# Patient Record
Sex: Female | Born: 1970 | Race: White | Hispanic: No | Marital: Married | Smoking: Never smoker
Health system: Southern US, Community
[De-identification: ages and names within clinical notes are randomized; demographics above are authoritative.]

## PROBLEM LIST (undated history)

## (undated) DIAGNOSIS — E559 Vitamin D deficiency, unspecified: Secondary | ICD-10-CM

## (undated) DIAGNOSIS — T7840XA Allergy, unspecified, initial encounter: Secondary | ICD-10-CM

## (undated) DIAGNOSIS — M1990 Unspecified osteoarthritis, unspecified site: Secondary | ICD-10-CM

## (undated) DIAGNOSIS — D6851 Activated protein C resistance: Secondary | ICD-10-CM

## (undated) DIAGNOSIS — N3281 Overactive bladder: Secondary | ICD-10-CM

## (undated) HISTORY — DX: Unspecified osteoarthritis, unspecified site: M19.90

## (undated) HISTORY — DX: Allergy, unspecified, initial encounter: T78.40XA

## (undated) HISTORY — PX: ABLATION: SHX5711

## (undated) HISTORY — PX: ABDOMINAL HYSTERECTOMY: SHX81

## (undated) HISTORY — PX: WISDOM TOOTH EXTRACTION: SHX21

## (undated) HISTORY — DX: Activated protein C resistance: D68.51

## (undated) HISTORY — DX: Overactive bladder: N32.81

## (undated) HISTORY — DX: Vitamin D deficiency, unspecified: E55.9

---

## 1997-11-09 ENCOUNTER — Other Ambulatory Visit: Admission: RE | Admit: 1997-11-09 | Discharge: 1997-11-09 | Payer: Self-pay | Admitting: Obstetrics and Gynecology

## 1999-02-25 HISTORY — PX: AUGMENTATION MAMMAPLASTY: SUR837

## 1999-02-25 HISTORY — PX: BREAST SURGERY: SHX581

## 1999-03-25 ENCOUNTER — Other Ambulatory Visit: Admission: RE | Admit: 1999-03-25 | Discharge: 1999-03-25 | Payer: Self-pay | Admitting: Obstetrics and Gynecology

## 2000-08-20 ENCOUNTER — Other Ambulatory Visit: Admission: RE | Admit: 2000-08-20 | Discharge: 2000-08-20 | Payer: Self-pay | Admitting: Obstetrics and Gynecology

## 2002-05-11 ENCOUNTER — Other Ambulatory Visit: Admission: RE | Admit: 2002-05-11 | Discharge: 2002-05-11 | Payer: Self-pay | Admitting: Obstetrics and Gynecology

## 2003-06-14 ENCOUNTER — Other Ambulatory Visit: Admission: RE | Admit: 2003-06-14 | Discharge: 2003-06-14 | Payer: Self-pay | Admitting: Obstetrics and Gynecology

## 2004-07-01 ENCOUNTER — Other Ambulatory Visit: Admission: RE | Admit: 2004-07-01 | Discharge: 2004-07-01 | Payer: Self-pay | Admitting: Obstetrics and Gynecology

## 2004-09-11 ENCOUNTER — Ambulatory Visit: Payer: Self-pay | Admitting: Internal Medicine

## 2005-09-29 ENCOUNTER — Encounter: Admission: RE | Admit: 2005-09-29 | Discharge: 2005-09-29 | Payer: Self-pay | Admitting: Obstetrics and Gynecology

## 2010-08-17 ENCOUNTER — Encounter: Payer: Self-pay | Admitting: Obstetrics and Gynecology

## 2013-05-18 ENCOUNTER — Other Ambulatory Visit: Payer: Self-pay | Admitting: Obstetrics and Gynecology

## 2013-05-18 DIAGNOSIS — R928 Other abnormal and inconclusive findings on diagnostic imaging of breast: Secondary | ICD-10-CM

## 2013-06-01 ENCOUNTER — Ambulatory Visit
Admission: RE | Admit: 2013-06-01 | Discharge: 2013-06-01 | Disposition: A | Discharge status: Home / Self Care | Payer: PRIVATE HEALTH INSURANCE | Source: Ambulatory Visit | Attending: Obstetrics and Gynecology | Admitting: Obstetrics and Gynecology

## 2013-06-01 DIAGNOSIS — R928 Other abnormal and inconclusive findings on diagnostic imaging of breast: Secondary | ICD-10-CM

## 2013-06-13 ENCOUNTER — Encounter (HOSPITAL_COMMUNITY): Payer: Self-pay | Admitting: Pharmacist

## 2013-06-14 ENCOUNTER — Encounter (HOSPITAL_COMMUNITY): Payer: Self-pay

## 2013-06-14 ENCOUNTER — Encounter (HOSPITAL_COMMUNITY)
Admission: RE | Admit: 2013-06-14 | Discharge: 2013-06-14 | Disposition: A | Discharge status: Home / Self Care | Payer: PRIVATE HEALTH INSURANCE | Source: Ambulatory Visit | Attending: Obstetrics and Gynecology | Admitting: Obstetrics and Gynecology

## 2013-06-14 DIAGNOSIS — Z01812 Encounter for preprocedural laboratory examination: Secondary | ICD-10-CM | POA: Insufficient documentation

## 2013-06-14 DIAGNOSIS — Z01818 Encounter for other preprocedural examination: Secondary | ICD-10-CM | POA: Insufficient documentation

## 2013-06-14 LAB — CBC
Hemoglobin: 15 g/dL (ref 12.0–15.0)
Platelets: 190 10*3/uL (ref 150–400)
RBC: 5.07 MIL/uL (ref 3.87–5.11)
WBC: 6.2 10*3/uL (ref 4.0–10.5)

## 2013-06-14 NOTE — Patient Instructions (Addendum)
   Your procedure is scheduled on:  Monday, Dec 1  Enter through the [employer] [facility] at: 6 AM Pick up the phone at the desk and dial [phone] and inform us of your arrival.  Please call this number if you have any problems the morning of surgery: [phone]  Remember: Do not eat or drink after midnight: Sunday Take these medicines the morning of surgery with a SIP OF WATER:  None  Do not wear jewelry, make-up, or FINGER nail polish No metal in your hair or on your body. Do not wear lotions, powders, perfumes. You may wear deodorant.  Please use your CHG wash as directed prior to surgery.  Do not shave anywhere for at least 12 hours prior to first CHG shower.  Do not bring valuables to the hospital. Contacts, dentures or bridgework may not be worn into surgery.  Leave suitcase in the car. After Surgery it may be brought to your room. For patients being admitted to the hospital, checkout time is 11:00am the day of discharge.  Home with husband Caryn Bee  cell [phone] or daughter Lorelle Formosa

## 2013-06-20 NOTE — H&P (Addendum)
42 yo with large fibroid uterus presents for surgical mngt.  PMHx:  neg PSHx:  Breast augmentation, SVD x 2 All:  None Meds: none SHx:  Negative x 3 FHx;  Pancreatic ca  AF, VSS Gen - NAD Abd - soft, NT CV - RRR Lungs - clear Ext NT PV - 16 wk size uterus  US:  7.8 cm fibroid, normal ovaries bilaterally  A/P:  Fibroid LAVH, possible TAH R/b/a reveiwed, plan of care discussed.  Informed consent obtained

## 2013-06-25 MED ORDER — DEXTROSE 5 % IV SOLN
2.0000 g | INTRAVENOUS | Status: AC
  Administered 2013-06-26: 2 g via INTRAVENOUS
  Filled 2013-06-25: qty 2

## 2013-06-26 ENCOUNTER — Observation Stay (HOSPITAL_COMMUNITY)
Admission: RE | Admit: 2013-06-26 | Discharge: 2013-06-27 | Disposition: A | Discharge status: Home / Self Care | Payer: PRIVATE HEALTH INSURANCE | Source: Ambulatory Visit | Attending: Obstetrics and Gynecology | Admitting: Obstetrics and Gynecology

## 2013-06-26 ENCOUNTER — Encounter (HOSPITAL_COMMUNITY): Payer: Self-pay

## 2013-06-26 ENCOUNTER — Encounter (HOSPITAL_COMMUNITY): Payer: PRIVATE HEALTH INSURANCE | Admitting: Anesthesiology

## 2013-06-26 ENCOUNTER — Encounter (HOSPITAL_COMMUNITY)
Admission: RE | Disposition: A | Discharge status: Home / Self Care | Payer: Self-pay | Source: Ambulatory Visit | Attending: Obstetrics and Gynecology

## 2013-06-26 ENCOUNTER — Ambulatory Visit (HOSPITAL_COMMUNITY): Payer: PRIVATE HEALTH INSURANCE | Admitting: Anesthesiology

## 2013-06-26 DIAGNOSIS — D219 Benign neoplasm of connective and other soft tissue, unspecified: Secondary | ICD-10-CM | POA: Diagnosis present

## 2013-06-26 DIAGNOSIS — D259 Leiomyoma of uterus, unspecified: Principal | ICD-10-CM | POA: Insufficient documentation

## 2013-06-26 HISTORY — PX: LAPAROSCOPIC ASSISTED VAGINAL HYSTERECTOMY: SHX5398

## 2013-06-26 LAB — TYPE AND SCREEN: ABO/RH(D): A POS

## 2013-06-26 LAB — ABO/RH: ABO/RH(D): A POS

## 2013-06-26 SURGERY — HYSTERECTOMY, VAGINAL, LAPAROSCOPY-ASSISTED
Anesthesia: General | Wound class: Clean Contaminated

## 2013-06-26 MED ORDER — KETOROLAC TROMETHAMINE 30 MG/ML IJ SOLN
30.0000 mg | Freq: Three times a day (TID) | INTRAMUSCULAR | Status: AC
  Administered 2013-06-26 – 2013-06-27 (×3): 30 mg via INTRAVENOUS
  Filled 2013-06-26 (×2): qty 1

## 2013-06-26 MED ORDER — DEXTROSE IN LACTATED RINGERS 5 % IV SOLN
INTRAVENOUS | Status: DC
  Administered 2013-06-26 – 2013-06-27 (×2): via INTRAVENOUS

## 2013-06-26 MED ORDER — MENTHOL 3 MG MT LOZG: 1.0000 | LOZENGE | OROMUCOSAL | Status: DC | PRN

## 2013-06-26 MED ORDER — OXYCODONE-ACETAMINOPHEN 5-325 MG PO TABS
1.0000 | ORAL_TABLET | ORAL | Status: DC | PRN
  Administered 2013-06-26: 1 via ORAL
  Filled 2013-06-26: qty 1

## 2013-06-26 MED ORDER — NEOSTIGMINE METHYLSULFATE 1 MG/ML IJ SOLN
INTRAMUSCULAR | Status: AC
  Filled 2013-06-26: qty 1

## 2013-06-26 MED ORDER — HYDROMORPHONE HCL PF 1 MG/ML IJ SOLN
INTRAMUSCULAR | Status: AC
  Filled 2013-06-26: qty 1

## 2013-06-26 MED ORDER — GLYCOPYRROLATE 0.2 MG/ML IJ SOLN
INTRAMUSCULAR | Status: AC
  Filled 2013-06-26: qty 1

## 2013-06-26 MED ORDER — GLYCOPYRROLATE 0.2 MG/ML IJ SOLN
INTRAMUSCULAR | Status: AC
  Filled 2013-06-26: qty 2

## 2013-06-26 MED ORDER — PROPOFOL 10 MG/ML IV EMUL
INTRAVENOUS | Status: AC
  Filled 2013-06-26: qty 20

## 2013-06-26 MED ORDER — BUPIVACAINE HCL (PF) 0.25 % IJ SOLN
INTRAMUSCULAR | Status: DC | PRN
  Administered 2013-06-26: 5 mL

## 2013-06-26 MED ORDER — ONDANSETRON HCL 4 MG/2ML IJ SOLN: 4.0000 mg | Freq: Four times a day (QID) | INTRAMUSCULAR | Status: DC | PRN

## 2013-06-26 MED ORDER — ONDANSETRON HCL 4 MG/2ML IJ SOLN
INTRAMUSCULAR | Status: DC | PRN
  Administered 2013-06-26: 4 mg via INTRAVENOUS

## 2013-06-26 MED ORDER — FENTANYL CITRATE 0.05 MG/ML IJ SOLN
INTRAMUSCULAR | Status: DC | PRN
  Administered 2013-06-26: 100 ug via INTRAVENOUS
  Administered 2013-06-26: 50 ug via INTRAVENOUS
  Administered 2013-06-26: 100 ug via INTRAVENOUS
  Administered 2013-06-26: 50 ug via INTRAVENOUS
  Administered 2013-06-26: 100 ug via INTRAVENOUS
  Administered 2013-06-26: 50 ug via INTRAVENOUS

## 2013-06-26 MED ORDER — BUPIVACAINE HCL (PF) 0.25 % IJ SOLN
INTRAMUSCULAR | Status: AC
  Filled 2013-06-26: qty 30

## 2013-06-26 MED ORDER — PROMETHAZINE HCL 25 MG/ML IJ SOLN
6.2500 mg | INTRAMUSCULAR | Status: AC | PRN
  Administered 2013-06-26 (×2): 6.25 mg via INTRAVENOUS

## 2013-06-26 MED ORDER — PROMETHAZINE HCL 25 MG/ML IJ SOLN
INTRAMUSCULAR | Status: AC
  Administered 2013-06-26: 6.25 mg via INTRAVENOUS
  Filled 2013-06-26: qty 1

## 2013-06-26 MED ORDER — ONDANSETRON HCL 4 MG PO TABS: 4.0000 mg | ORAL_TABLET | Freq: Four times a day (QID) | ORAL | Status: DC | PRN

## 2013-06-26 MED ORDER — ONDANSETRON HCL 4 MG/2ML IJ SOLN
INTRAMUSCULAR | Status: AC
  Filled 2013-06-26: qty 2

## 2013-06-26 MED ORDER — LIDOCAINE HCL (CARDIAC) 20 MG/ML IV SOLN
INTRAVENOUS | Status: AC
  Filled 2013-06-26: qty 5

## 2013-06-26 MED ORDER — DEXAMETHASONE SODIUM PHOSPHATE 10 MG/ML IJ SOLN
INTRAMUSCULAR | Status: AC
  Filled 2013-06-26: qty 1

## 2013-06-26 MED ORDER — NEOSTIGMINE METHYLSULFATE 1 MG/ML IJ SOLN
INTRAMUSCULAR | Status: DC | PRN
  Administered 2013-06-26: 2 mg via INTRAVENOUS

## 2013-06-26 MED ORDER — LACTATED RINGERS IV SOLN
INTRAVENOUS | Status: DC
  Administered 2013-06-26: 08:00:00 via INTRAVENOUS
  Administered 2013-06-26: 125 mL/h via INTRAVENOUS
  Administered 2013-06-26: 08:00:00 via INTRAVENOUS

## 2013-06-26 MED ORDER — MIDAZOLAM HCL 2 MG/2ML IJ SOLN
INTRAMUSCULAR | Status: DC | PRN
  Administered 2013-06-26: 2 mg via INTRAVENOUS

## 2013-06-26 MED ORDER — HYDROMORPHONE HCL PF 1 MG/ML IJ SOLN
0.2500 mg | INTRAMUSCULAR | Status: DC | PRN
  Administered 2013-06-26 (×2): 0.5 mg via INTRAVENOUS

## 2013-06-26 MED ORDER — LIDOCAINE HCL (CARDIAC) 20 MG/ML IV SOLN
INTRAVENOUS | Status: DC | PRN
  Administered 2013-06-26: 50 mg via INTRAVENOUS

## 2013-06-26 MED ORDER — DEXAMETHASONE SODIUM PHOSPHATE 10 MG/ML IJ SOLN
INTRAMUSCULAR | Status: DC | PRN
  Administered 2013-06-26: 10 mg via INTRAVENOUS

## 2013-06-26 MED ORDER — ROCURONIUM BROMIDE 100 MG/10ML IV SOLN
INTRAVENOUS | Status: AC
  Filled 2013-06-26: qty 1

## 2013-06-26 MED ORDER — FENTANYL CITRATE 0.05 MG/ML IJ SOLN
INTRAMUSCULAR | Status: AC
  Filled 2013-06-26: qty 5

## 2013-06-26 MED ORDER — ROCURONIUM BROMIDE 100 MG/10ML IV SOLN
INTRAVENOUS | Status: DC | PRN
  Administered 2013-06-26 (×2): 10 mg via INTRAVENOUS
  Administered 2013-06-26: 40 mg via INTRAVENOUS

## 2013-06-26 MED ORDER — KETOROLAC TROMETHAMINE 30 MG/ML IJ SOLN
INTRAMUSCULAR | Status: AC
  Filled 2013-06-26: qty 1

## 2013-06-26 MED ORDER — HYDROMORPHONE HCL PF 1 MG/ML IJ SOLN
1.0000 mg | INTRAMUSCULAR | Status: DC | PRN
  Administered 2013-06-26: 1 mg via INTRAVENOUS
  Filled 2013-06-26: qty 1

## 2013-06-26 MED ORDER — PROPOFOL 10 MG/ML IV BOLUS
INTRAVENOUS | Status: DC | PRN
  Administered 2013-06-26: 180 mg via INTRAVENOUS
  Administered 2013-06-26: 50 mg via INTRAVENOUS
  Administered 2013-06-26: 20 mg via INTRAVENOUS

## 2013-06-26 MED ORDER — MIDAZOLAM HCL 2 MG/2ML IJ SOLN
INTRAMUSCULAR | Status: AC
  Filled 2013-06-26: qty 2

## 2013-06-26 MED ORDER — GLYCOPYRROLATE 0.2 MG/ML IJ SOLN
INTRAMUSCULAR | Status: DC | PRN
  Administered 2013-06-26: 0.4 mg via INTRAVENOUS
  Administered 2013-06-26: 0.2 mg via INTRAVENOUS

## 2013-06-26 SURGICAL SUPPLY — 54 items
ADH SKN CLS APL DERMABOND .7 (GAUZE/BANDAGES/DRESSINGS) ×2
CABLE HIGH FREQUENCY MONO STRZ (ELECTRODE) IMPLANT
CANISTER SUCT 3000ML (MISCELLANEOUS) ×2 IMPLANT
CATH FOLEY LATEX FREE 12 FR (CATHETERS) ×2
CATH FOLEY LF 12 FR (CATHETERS) IMPLANT
CHLORAPREP W/TINT 26ML (MISCELLANEOUS) ×4 IMPLANT
CLOTH BEACON ORANGE TIMEOUT ST (SAFETY) ×2 IMPLANT
CONT PATH 16OZ SNAP LID 3702 (MISCELLANEOUS) ×2 IMPLANT
COVER TABLE BACK 60X90 (DRAPES) ×2 IMPLANT
DECANTER SPIKE VIAL GLASS SM (MISCELLANEOUS) IMPLANT
DERMABOND ADVANCED (GAUZE/BANDAGES/DRESSINGS) ×2
DERMABOND ADVANCED .7 DNX12 (GAUZE/BANDAGES/DRESSINGS) ×2 IMPLANT
DRAPE WARM FLUID 44X44 (DRAPE) IMPLANT
ELECT LIGASURE LONG (ELECTRODE) IMPLANT
ELECT LIGASURE SHORT 9 REUSE (ELECTRODE) IMPLANT
ELECT REM PT RETURN 9FT ADLT (ELECTROSURGICAL)
ELECTRODE REM PT RTRN 9FT ADLT (ELECTROSURGICAL) IMPLANT
GAUZE SPONGE 4X4 16PLY XRAY LF (GAUZE/BANDAGES/DRESSINGS) IMPLANT
GLOVE BIO SURGEON STRL SZ 6.5 (GLOVE) ×2 IMPLANT
GLOVE BIOGEL PI IND STRL 6.5 (GLOVE) ×1 IMPLANT
GLOVE BIOGEL PI IND STRL 7.0 (GLOVE) ×1 IMPLANT
GLOVE BIOGEL PI INDICATOR 6.5 (GLOVE) ×1
GLOVE BIOGEL PI INDICATOR 7.0 (GLOVE) ×1
GOWN STRL REIN XL XLG (GOWN DISPOSABLE) ×8 IMPLANT
HEMOSTAT SURGICEL 4X8 (HEMOSTASIS) IMPLANT
NS IRRIG 1000ML POUR BTL (IV SOLUTION) ×2 IMPLANT
PACK ABDOMINAL GYN (CUSTOM PROCEDURE TRAY) ×2 IMPLANT
PACK LAVH (CUSTOM PROCEDURE TRAY) ×2 IMPLANT
PAD OB MATERNITY 4.3X12.25 (PERSONAL CARE ITEMS) ×2 IMPLANT
PROTECTOR NERVE ULNAR (MISCELLANEOUS) ×2 IMPLANT
SEALER TISSUE G2 CVD JAW 45CM (ENDOMECHANICALS) ×2 IMPLANT
SET IRRIG TUBING LAPAROSCOPIC (IRRIGATION / IRRIGATOR) IMPLANT
SPONGE LAP 18X18 X RAY DECT (DISPOSABLE) ×4 IMPLANT
STAPLER VISISTAT 35W (STAPLE) IMPLANT
SUT MNCRL 0 MO-4 VIOLET 18 CR (SUTURE) ×3 IMPLANT
SUT MON AB 2-0 CT1 36 (SUTURE) ×2 IMPLANT
SUT MON AB-0 CT1 36 (SUTURE) ×3 IMPLANT
SUT MONOCRYL 0 MO 4 18  CR/8 (SUTURE) ×3
SUT PDS AB 0 CTX 60 (SUTURE) ×2 IMPLANT
SUT PLAIN 2 0 XLH (SUTURE) IMPLANT
SUT VIC AB 3-0 PS2 18 (SUTURE) ×2
SUT VIC AB 3-0 PS2 18XBRD (SUTURE) ×1 IMPLANT
SUT VIC AB 3-0 X1 27 (SUTURE) ×1 IMPLANT
SUT VIC AB 4-0 KS 27 (SUTURE) IMPLANT
SUT VICRYL 0 TIES 12 18 (SUTURE) ×2 IMPLANT
SUT VICRYL 0 UR6 27IN ABS (SUTURE) ×2 IMPLANT
SYR CONTROL 10ML LL (SYRINGE) IMPLANT
TOWEL OR 17X24 6PK STRL BLUE (TOWEL DISPOSABLE) ×4 IMPLANT
TRAY FOLEY CATH 14FR (SET/KITS/TRAYS/PACK) ×2 IMPLANT
TROCAR OPTI TIP 5M 100M (ENDOMECHANICALS) ×2 IMPLANT
TROCAR XCEL NON-BLD 11X100MML (ENDOMECHANICALS) ×2 IMPLANT
TROCAR XCEL OPT SLVE 5M 100M (ENDOMECHANICALS) ×1 IMPLANT
WARMER LAPAROSCOPE (MISCELLANEOUS) ×2 IMPLANT
WATER STERILE IRR 1000ML POUR (IV SOLUTION) ×2 IMPLANT

## 2013-06-26 NOTE — Transfer of Care (Signed)
Immediate Anesthesia Transfer of Care Note  Patient: Kaitlyn Hardy  Procedure(s) Performed: Procedure(s): LAPAROSCOPIC ASSISTED VAGINAL HYSTERECTOMY (N/A)  Patient Location: PACU  Anesthesia Type:General  Level of Consciousness: awake  Airway & Oxygen Therapy: Patient Spontanous Breathing  Post-op Assessment: Report given to PACU RN  Post vital signs: stable  Filed Vitals:   06/26/13 0614  BP: 124/56  Pulse: 65  Temp: 36.5 C  Resp: 20    Complications: No apparent anesthesia complications

## 2013-06-26 NOTE — Anesthesia Preprocedure Evaluation (Signed)

## 2013-06-26 NOTE — Addendum Note (Signed)
Addendum created 06/26/13 1049 by Algis Greenhouse, CRNA   Modules edited: Anesthesia Medication Administration

## 2013-06-26 NOTE — Anesthesia Postprocedure Evaluation (Signed)
Anesthesia Post Note  Patient: Kaitlyn Hardy  Procedure(s) Performed: Procedure(s) (LRB): LAPAROSCOPIC ASSISTED VAGINAL HYSTERECTOMY (N/A)  Anesthesia type: General  Patient location: [facility] Unit  Post pain: Pain level controlled  Post assessment: Post-op Vital signs reviewed  Last Vitals:  Filed Vitals:   06/26/13 1630  BP: 120/76  Pulse: 80  Temp: 36.7 C  Resp: 18    Post vital signs: Reviewed  Level of consciousness: sedated  Complications: No apparent anesthesia complications

## 2013-06-26 NOTE — Preoperative (Signed)
Beta Blockers   Reason not to administer Beta Blockers:Not Applicable 

## 2013-06-26 NOTE — Op Note (Signed)
06/26/2013  1:35 PM  PATIENT:  Kaitlyn Hardy  42 y.o. female  PRE-OPERATIVE DIAGNOSIS:  fibroid  POST-OPERATIVE DIAGNOSIS:  Fibroid  PROCEDURE:  Procedure(s): LAPAROSCOPIC ASSISTED VAGINAL HYSTERECTOMY (N/A)  SURGEON:  Surgeon(s) and Role:    * Zelphia Cairo, MD - Primary    PHYSICIAN ASSISTANT: Richardean Chimera, MD  ANESTHESIA:   general  EBL:  Total I/O In: 3500 [I.V.:3500] Out: 1025 [Urine:700; Blood:325] UOP clear throughout case  BLOOD ADMINISTERED:none  DRAINS: foley catheter   LOCAL MEDICATIONS USED:  MARCAINE     SPECIMEN:  Source of Specimen:  uterus with cervix  DISPOSITION OF SPECIMEN:  PATHOLOGY  COUNTS:  YES  DICTATION: .Other Dictation: Dictation Number pending  PLAN OF CARE: Admit for overnight observation  PATIENT DISPOSITION:  PACU - hemodynamically stable.   Delay start of Pharmacological VTE agent (>24hrs) due to surgical blood loss or risk of bleeding: no

## 2013-06-26 NOTE — Anesthesia Postprocedure Evaluation (Signed)
Anesthesia Post Note  Patient: Kaitlyn Hardy  Procedure(s) Performed: Procedure(s) (LRB): LAPAROSCOPIC ASSISTED VAGINAL HYSTERECTOMY (N/A)  Anesthesia type: General  Patient location: PACU  Post pain: Pain level controlled  Post assessment: Post-op Vital signs reviewed  Last Vitals:  Filed Vitals:   06/26/13 1030  BP:   Pulse:   Temp:   Resp: 16    Post vital signs: Reviewed  Level of consciousness: sedated  Complications: No apparent anesthesia complications

## 2013-06-26 NOTE — Progress Notes (Signed)
Day of Surgery Procedure(s) (LRB): LAPAROSCOPIC ASSISTED VAGINAL HYSTERECTOMY (N/A)  Subjective: Patient reports nausea and incisional pain.  Pain and nausea controlled with IV meds.  No CP or SOB.    Objective: I have reviewed patient's vital signs and intake and output.  General: cooperative and appears stated age GI: normal findings: soft, NT, appropriately tender Vaginal Bleeding: none  Assessment: s/p Procedure(s): LAPAROSCOPIC ASSISTED VAGINAL HYSTERECTOMY (N/A): stable  Plan: Advance diet routine postop care  LOS: 0 days    Kaitlyn Hardy 06/26/2013, 1:33 PM

## 2013-06-27 ENCOUNTER — Encounter (HOSPITAL_COMMUNITY): Payer: Self-pay | Admitting: Obstetrics and Gynecology

## 2013-06-27 LAB — CBC
HCT: 35.2 % — ABNORMAL LOW (ref 36.0–46.0)
Hemoglobin: 12.2 g/dL (ref 12.0–15.0)
MCHC: 34.7 g/dL (ref 30.0–36.0)
MCV: 86.9 fL (ref 78.0–100.0)
RDW: 12.3 % (ref 11.5–15.5)
WBC: 13.3 10*3/uL — ABNORMAL HIGH (ref 4.0–10.5)

## 2013-06-27 MED ORDER — OXYCODONE-ACETAMINOPHEN 5-325 MG PO TABS: 1.0000 | ORAL_TABLET | ORAL | Status: DC | PRN

## 2013-06-27 NOTE — Discharge Summary (Signed)
Physician Discharge Summary  Patient ID: Kaitlyn Hardy MRN: [id number] DOB/AGE: 43-Aug-1972 43 y.o.  Admit date: 06/26/2013 Discharge date: 06/27/2013  Admission Diagnoses: Fibroid  Discharge Diagnoses:  Active Problems:   Fibroid   Discharged Condition: stable  Hospital Course: Pt was admitted for postop care.  Initially pain was controlled with IV meds and foley catheter was in place.  Once pt was able to ambulate, foley was removed and she was able to ambulate to restroom and void.  On POD 1, CBC and vital signs stable and pain controlled with PO meds. She was able to tolerate PO diet and felt stable for discharge  Consults: None  Significant Diagnostic Studies: labs: CBC  Treatments: surgery: LAVH  Discharge Exam: Blood pressure 93/59, pulse 80, temperature 99 F (37.2 C), temperature source Oral, resp. rate 18, height 5\' 6"  (1.676 m), weight 65.772 kg (145 lb), last menstrual period 05/07/2013, SpO2 98.00%. General appearance: alert and cooperative GI: normal findings: bowel sounds normal and soft, non-tender Extremities: extremities normal, atraumatic, no cyanosis or edema Incision/Wound: clean and intact  Disposition: Final discharge disposition not confirmed     Medication List         CALCIUM PO  Take 1 tablet by mouth daily.     cholecalciferol 1000 UNITS tablet  Commonly known as:  VITAMIN D  Take 2,000 Units by mouth daily.     ibuprofen 200 MG tablet  Commonly known as:  ADVIL,MOTRIN  Take 800 mg by mouth every 6 (six) hours as needed for moderate pain.     JUICE PLUS FIBRE PO  Take 2 capsules by mouth daily.     oxyCODONE-acetaminophen 5-325 MG per tablet  Commonly known as:  PERCOCET/ROXICET  Take 1-2 tablets by mouth every 4 (four) hours as needed for severe pain (moderate to severe pain (when tolerating fluids)).     PRESCRIPTION MEDICATION  Apply 1 application topically at bedtime. Compounded hormone cream           Follow-up Information    Schedule an appointment as soon as possible for a visit in 2 weeks to follow up.      Signed: Ericka Marcellus 06/27/2013, 7:15 AM

## 2013-06-27 NOTE — Progress Notes (Signed)
Pt is discharged in the care of husband. Downstairs per ambulatory,escorted by N.t.. Denies  Pain or discomfort. Spirit are good.Discharge instructions with Rx were given to pt. Questions were asked and answered.. Lapsites are clean and dry. Stable.

## 2013-06-28 NOTE — Op Note (Signed)
Kaitlyn Hardy, Kaitlyn Hardy                  ACCOUNT NO.:  [id number]  MEDICAL RECORD NO.:  [id number]  LOCATION:  9309                          FACILITY:  [facility]  PHYSICIAN:  Zelphia Cairo, MD    DATE OF BIRTH:  [birth year]  DATE OF PROCEDURE:  06/26/2013 DATE OF DISCHARGE:  06/27/2013                              OPERATIVE REPORT   PREOPERATIVE DIAGNOSIS:  Symptomatic fibroid uterus.  POSTOPERATIVE DIAGNOSIS:  Symptomatic fibroid uterus.  PROCEDURE:  Laparoscopic-assisted vaginal hysterectomy.  SURGEON:  Zelphia Cairo, MD  ASSISTANT:  Juluis Mire, MD  BLOOD LOSS:  350 mL.  URINE OUTPUT:  Clear.  SPECIMEN:  Uterus and cervix.  COMPLICATIONS:  None.  CONDITION:  Stable to recovery room.  DESCRIPTION OF PROCEDURE:  The patient was taken to the operating room. After informed consent was obtained, she was given general anesthesia and placed in the dorsal lithotomy position using Allen stirrups.  She was prepped and draped in sterile fashion.  Bivalve speculum was placed in the vagina and a single-tooth tenaculum attached to the anterior lip of the cervix.  Hulka clamp was applied.  Single-tooth tenaculum and speculum were removed.  A Foley catheter was inserted sterilely and our attention was turned to the abdomen.  A 0.25% Marcaine was used to provide local anesthesia at the site of our infraumbilical and suprapubic incisions.  Incision was made with a scalpel and extended bluntly to the level of the fascia using a Kelly clamp.  Optical trocar was inserted under direct visualization.  CO2 was turned on and the abdomen and pelvis were insufflated, a survey was performed.  Right upper quadrant, appendix appeared normal.  The uterus was significantly enlarged with a fibroid.  Bilateral adnexa appeared normal.  A 5-mm incision was made at the suprapubic region and a 5-mm trocar was inserted under direct visualization.  The uterus was manipulated to the patient's left side and the  EnSeal device was inserted through the operative port of the laparoscope.  The EnSeal was used to cauterize and cut the right fallopian tube.  The incision was extended down the broad ligament to the level of the round ligament.  The round ligament was grasped, cauterized, and cut using the EnSeal.  Excellent hemostasis was noted and our attention was turned to the left.  The uterus was manipulated to the patient's right and the left fallopian tube was grasped, cauterized, and cut with the EnSeal.  This was extended down the broad ligament to the level of the round ligament.  The round ligament was grasped, cauterized, and cut.  All instruments were then removed from the abdomen and our attention was turned to the vagina.  A Hulka clamp was removed from the cervix and a weighted speculum was placed posteriorly and a Deaver was placed anteriorly.  A single-tooth tenaculum was used to grasp the cervix.  A circumferential incision was made with the Bovie.  The posterior cul-de-sac was then entered sharply with Metzenbaum scissors and a long weighted speculum was placed in the posterior cul-de-sac.  Bilateral uterosacral ligaments were clamped with curved Heaney, cut with curved Mayo scissors and suture ligated. Excellent hemostasis was noted.  The  cardinal ligaments were then clamped bilaterally and suture ligated in similar fashion.  Uterine arteries and broad ligament were then serially clamped with Heaney clamps, transected, and suture ligated bilaterally.  Excellent hemostasis was visualized.  A wedge resection and the uterine cervix was then made to decompress the uterine fibroid to allow for delivery of the uterus to the vagina.  Serial wedge resections were performed in the vagina.  Once the uterus was adequately decompressed, the cornual pedicles were clamped with Heaney clamps, transected, and the uterus was delivered.  These pedicles were then suture ligated and hemostasis  was assured.  The posterior cul-de-sac was then reapproximated to the posterior peritoneum using a running locked stitch of Monocryl. Uterosacral ligaments were reapproximated and the remainder of the vaginal cuff was closed using figure-of-eight stitches in an interrupted fashion.  Once hemostasis was assured, our attention was returned to the abdomen.  The laparoscope was reinserted, the CO2 was turned on and a survey of all pedicles and the vaginal cuff was performed.  A small area of oozing was identified and this was cauterized using the bipolar.  Once hemostasis was assured, the pelvis was irrigated, and all instruments and trocars were removed from the abdomen.  A deep stitch was placed in the infraumbilical skin incision and the skin incisions were reapproximated with Vicryl.  Dermabond was placed.  The patient tolerated the procedure well.  Sponge, lap, needle, and instrument counts were correct x2.  She was taken to recovery in stable condition.     Zelphia Cairo, MD     GA/MEDQ  D:  06/27/2013  T:  06/28/2013  Job:  [id number]

## 2013-11-30 ENCOUNTER — Other Ambulatory Visit: Payer: Self-pay | Admitting: Obstetrics and Gynecology

## 2013-11-30 DIAGNOSIS — R921 Mammographic calcification found on diagnostic imaging of breast: Secondary | ICD-10-CM

## 2013-12-12 ENCOUNTER — Ambulatory Visit
Admission: RE | Admit: 2013-12-12 | Discharge: 2013-12-12 | Disposition: A | Discharge status: Home / Self Care | Payer: PRIVATE HEALTH INSURANCE | Source: Ambulatory Visit | Attending: Obstetrics and Gynecology | Admitting: Obstetrics and Gynecology

## 2013-12-12 DIAGNOSIS — R921 Mammographic calcification found on diagnostic imaging of breast: Secondary | ICD-10-CM

## 2014-05-03 ENCOUNTER — Other Ambulatory Visit: Payer: Self-pay | Admitting: Obstetrics and Gynecology

## 2014-05-03 DIAGNOSIS — R921 Mammographic calcification found on diagnostic imaging of breast: Secondary | ICD-10-CM

## 2014-06-12 ENCOUNTER — Ambulatory Visit
Admission: RE | Admit: 2014-06-12 | Discharge: 2014-06-12 | Disposition: A | Discharge status: Home / Self Care | Payer: PRIVATE HEALTH INSURANCE | Source: Ambulatory Visit | Attending: Obstetrics and Gynecology | Admitting: Obstetrics and Gynecology

## 2014-06-12 DIAGNOSIS — R921 Mammographic calcification found on diagnostic imaging of breast: Secondary | ICD-10-CM

## 2015-07-01 ENCOUNTER — Other Ambulatory Visit: Payer: Self-pay | Admitting: Obstetrics and Gynecology

## 2015-07-01 DIAGNOSIS — R921 Mammographic calcification found on diagnostic imaging of breast: Secondary | ICD-10-CM

## 2015-08-16 ENCOUNTER — Ambulatory Visit
Admission: RE | Admit: 2015-08-16 | Discharge: 2015-08-16 | Disposition: A | Discharge status: Home / Self Care | Payer: PRIVATE HEALTH INSURANCE | Source: Ambulatory Visit | Attending: Obstetrics and Gynecology | Admitting: Obstetrics and Gynecology

## 2015-08-16 DIAGNOSIS — R921 Mammographic calcification found on diagnostic imaging of breast: Secondary | ICD-10-CM

## 2016-08-25 ENCOUNTER — Other Ambulatory Visit: Payer: Self-pay | Admitting: Obstetrics and Gynecology

## 2016-08-25 DIAGNOSIS — Z1231 Encounter for screening mammogram for malignant neoplasm of breast: Secondary | ICD-10-CM

## 2016-10-19 ENCOUNTER — Ambulatory Visit
Admission: RE | Admit: 2016-10-19 | Discharge: 2016-10-19 | Disposition: A | Discharge status: Home / Self Care | Payer: PRIVATE HEALTH INSURANCE | Source: Ambulatory Visit | Attending: Obstetrics and Gynecology | Admitting: Obstetrics and Gynecology

## 2016-10-19 DIAGNOSIS — Z1231 Encounter for screening mammogram for malignant neoplasm of breast: Secondary | ICD-10-CM

## 2016-10-20 ENCOUNTER — Other Ambulatory Visit: Payer: Self-pay | Admitting: Obstetrics and Gynecology

## 2016-10-20 DIAGNOSIS — R928 Other abnormal and inconclusive findings on diagnostic imaging of breast: Secondary | ICD-10-CM

## 2016-10-23 ENCOUNTER — Ambulatory Visit
Admission: RE | Admit: 2016-10-23 | Discharge: 2016-10-23 | Disposition: A | Discharge status: Home / Self Care | Payer: PRIVATE HEALTH INSURANCE | Source: Ambulatory Visit | Attending: Obstetrics and Gynecology | Admitting: Obstetrics and Gynecology

## 2016-10-23 DIAGNOSIS — R928 Other abnormal and inconclusive findings on diagnostic imaging of breast: Secondary | ICD-10-CM

## 2017-05-05 ENCOUNTER — Other Ambulatory Visit: Payer: Self-pay | Admitting: Obstetrics and Gynecology

## 2017-05-05 DIAGNOSIS — N63 Unspecified lump in unspecified breast: Secondary | ICD-10-CM

## 2017-05-28 ENCOUNTER — Inpatient Hospital Stay
Admission: RE | Admit: 2017-05-28 | Discharge: 2017-05-28 | Disposition: A | Discharge status: Home / Self Care | Payer: PRIVATE HEALTH INSURANCE | Source: Ambulatory Visit | Attending: Obstetrics and Gynecology | Admitting: Obstetrics and Gynecology

## 2017-05-28 ENCOUNTER — Other Ambulatory Visit: Payer: PRIVATE HEALTH INSURANCE

## 2017-06-11 ENCOUNTER — Ambulatory Visit
Admission: RE | Admit: 2017-06-11 | Discharge: 2017-06-11 | Disposition: A | Discharge status: Home / Self Care | Payer: PRIVATE HEALTH INSURANCE | Source: Ambulatory Visit | Attending: Obstetrics and Gynecology | Admitting: Obstetrics and Gynecology

## 2017-06-11 ENCOUNTER — Other Ambulatory Visit: Payer: Self-pay | Admitting: Obstetrics and Gynecology

## 2017-06-11 DIAGNOSIS — N63 Unspecified lump in unspecified breast: Secondary | ICD-10-CM

## 2018-02-28 IMAGING — MG 2D DIGITAL DIAGNOSTIC UNILATERAL RIGHT MAMMOGRAM WITH CAD AND AD
6 series · 6 of 14 positions shown · non-contrast
Comparison: Previous exam(s).

CLINICAL DATA: Right medial breast asymmetry seen on most recent
screening mammography.

EXAM:
2D DIGITAL DIAGNOSTIC RIGHT MAMMOGRAM WITH CAD AND ADJUNCT TOMO
ULTRASOUND RIGHT BREAST

[R CC]
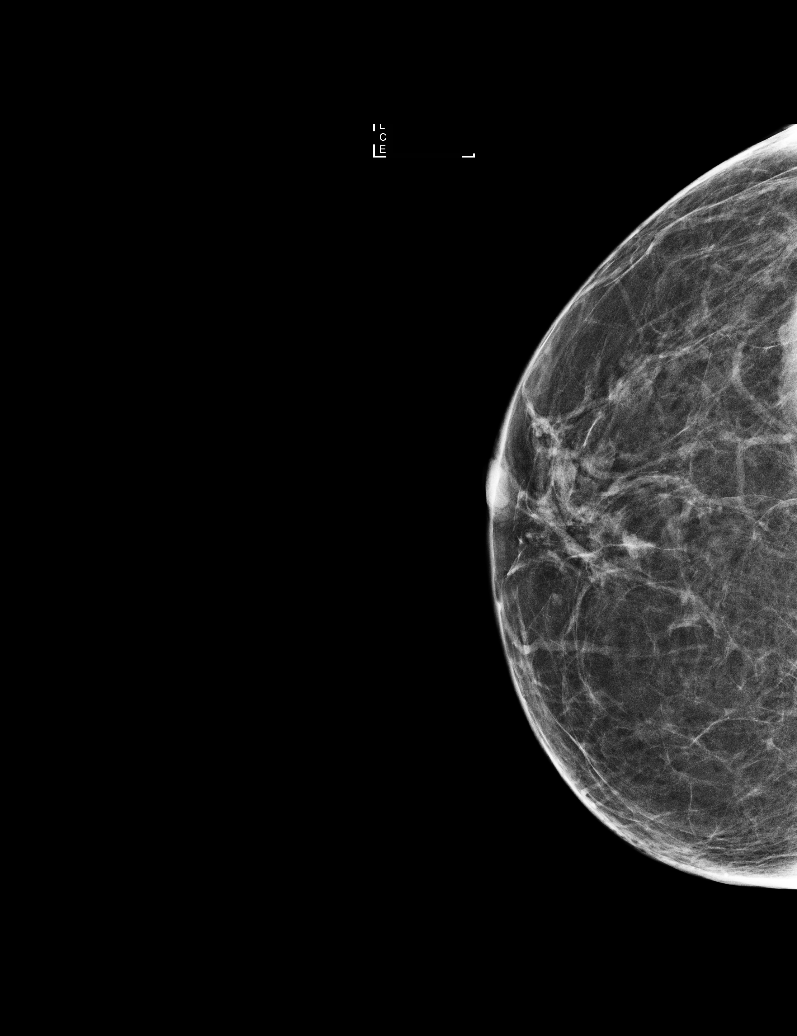

[R CC synth-2D]
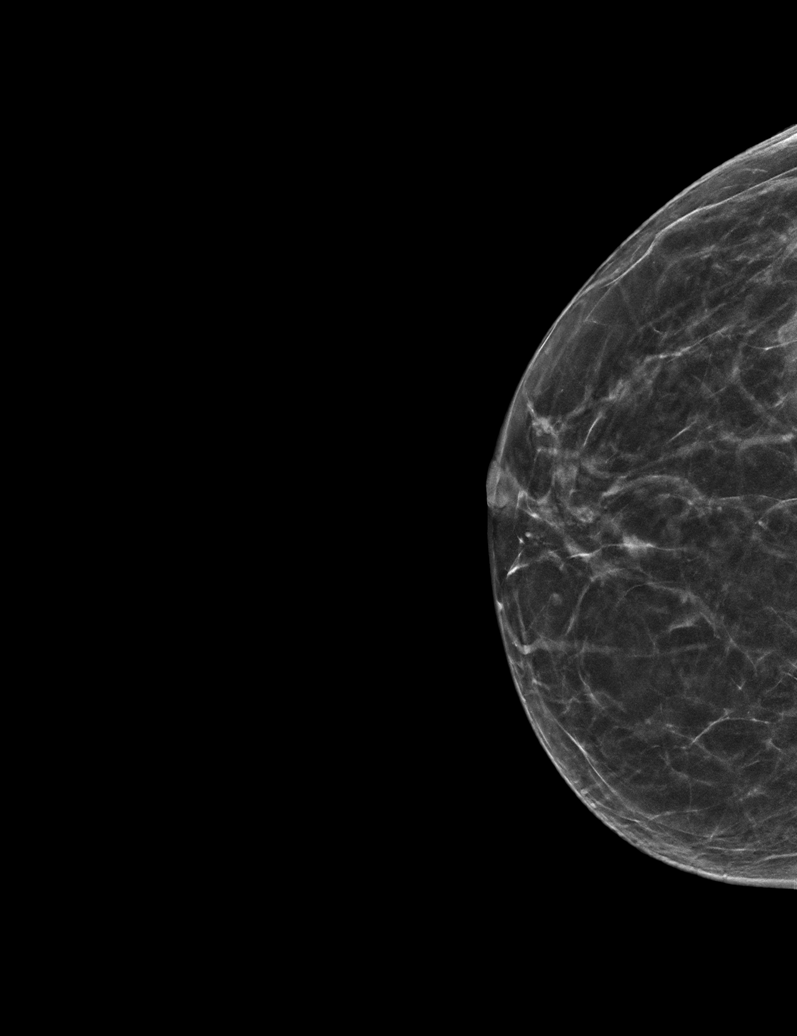

[R MLO]
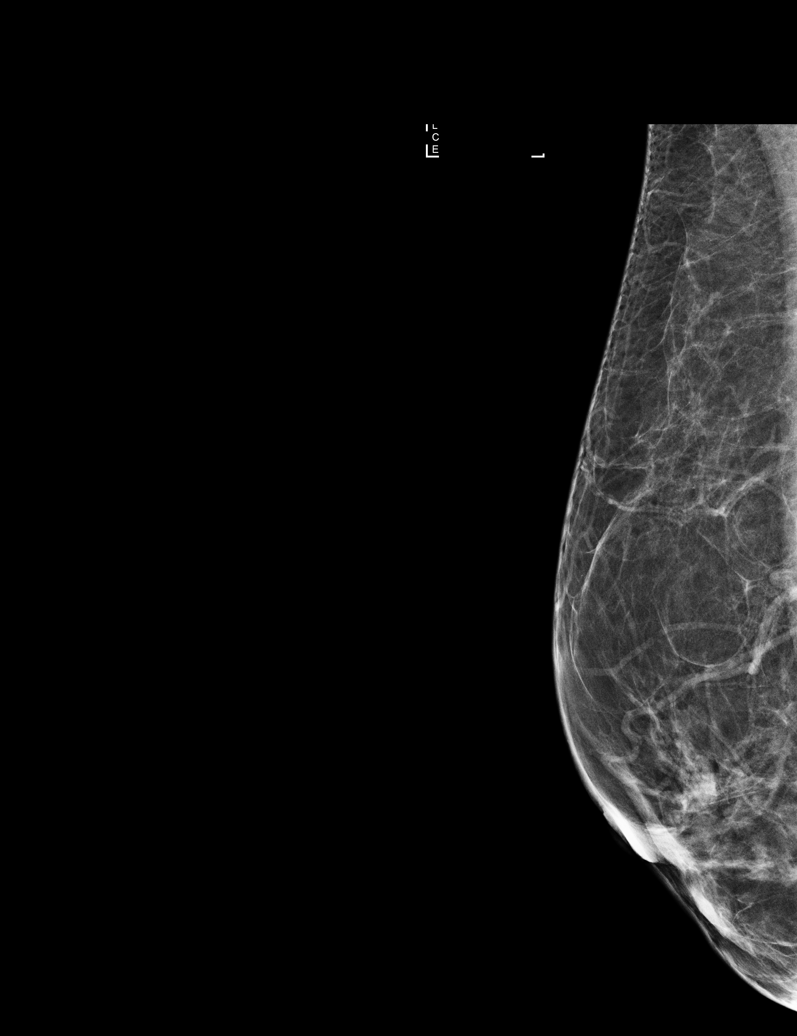

[R MLO synth-2D]
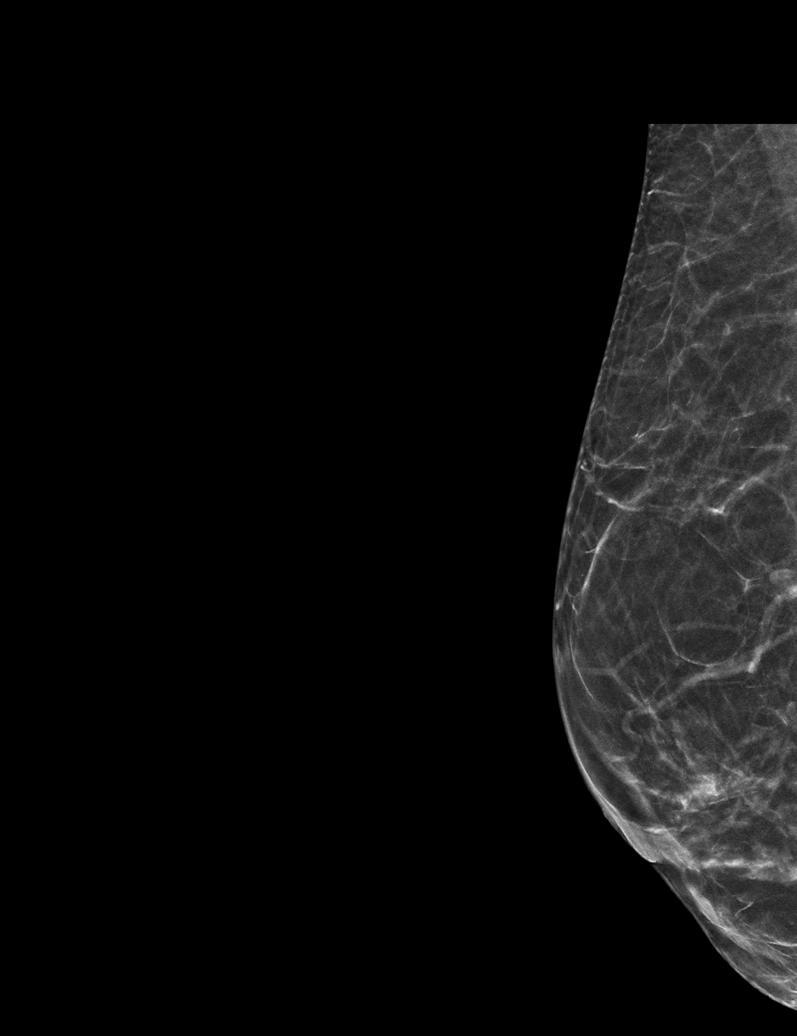

[R CCID BREAST TOMOSYNTHESIS IMAGE tomo · tomo slice 25/48.0]
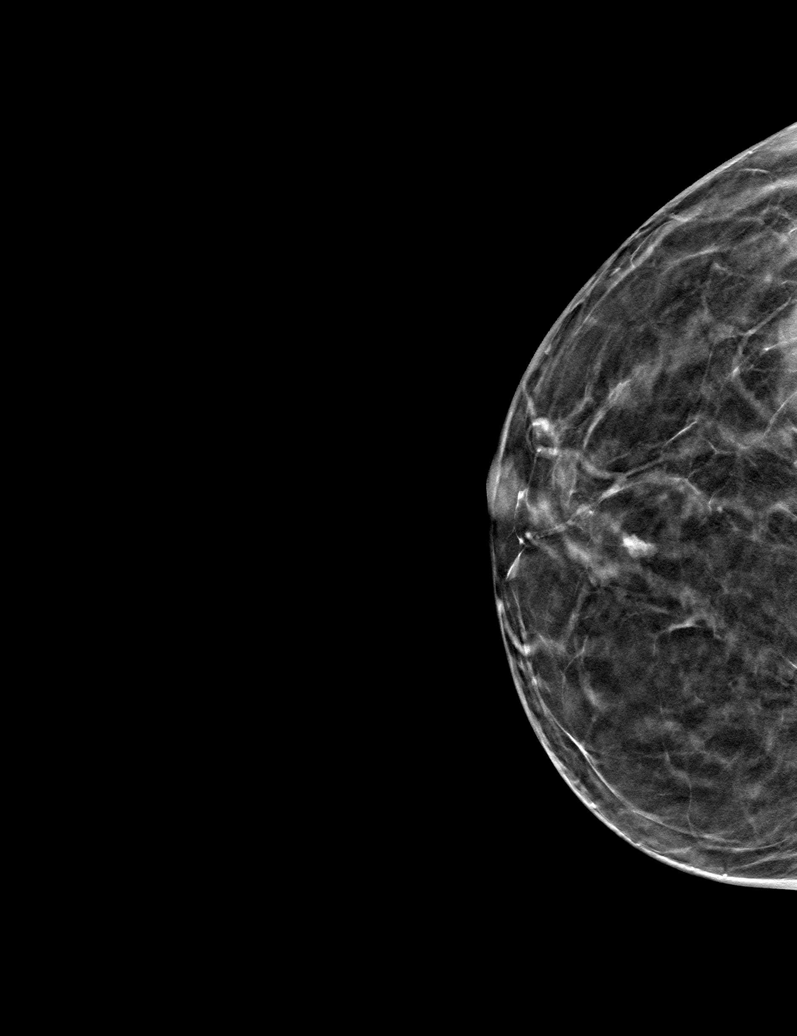

[R MLOID BREAST TOMOSYNTHESIS IMAGE tomo · tomo slice 21/42.0]
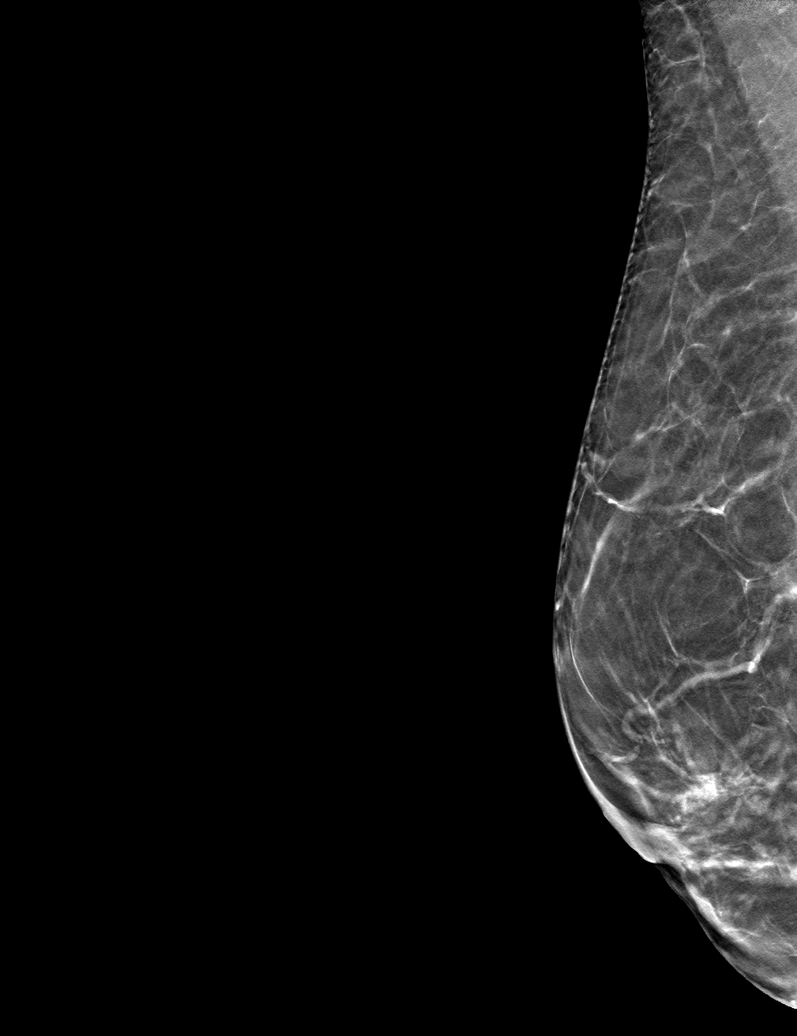

[6 of 14 positions shown; findings below may reference images not displayed]

ACR Breast Density Category b: There are scattered areas of
fibroglandular density.
FINDINGS: Mammographically, there is a persistent circumscribed isodense to
breast parenchyma nodule in the slightly upper inner right breast,
anterior to middle depth.

Mammographic images were processed with CAD.

On physical exam, no suspicious masses are palpated.

Targeted ultrasound is performed, showing right breast o'clock 2 cm
from the nipple benign-appearing hypoechoic circumscribed
horizontally oriented nodule which measures 0.5 by 0.3 by 0.4 cm. No
increased internal blood flow is noted. This finding likely
corresponds to the mammographically seen nodule.
IMPRESSION: Right breast o'clock probably benign 5 mm nodule, for which
six-month follow-up is recommended.

RECOMMENDATION:
Diagnostic mammogram and possibly ultrasound of the right breast in
6 months. (Code:[id number])

I have discussed the findings and recommendations with the patient.
Results were also provided in writing at the conclusion of the
visit. If applicable, a reminder letter will be sent to the patient
regarding the next appointment.

BI-RADS CATEGORY  3: Probably benign.

## 2018-03-20 DIAGNOSIS — M778 Other enthesopathies, not elsewhere classified: Secondary | ICD-10-CM | POA: Insufficient documentation

## 2019-04-10 ENCOUNTER — Other Ambulatory Visit: Payer: Self-pay | Admitting: Obstetrics and Gynecology

## 2019-04-10 DIAGNOSIS — R928 Other abnormal and inconclusive findings on diagnostic imaging of breast: Secondary | ICD-10-CM

## 2019-04-14 ENCOUNTER — Other Ambulatory Visit: Payer: Self-pay | Admitting: Obstetrics and Gynecology

## 2019-04-14 ENCOUNTER — Ambulatory Visit
Admission: RE | Admit: 2019-04-14 | Discharge: 2019-04-14 | Disposition: A | Discharge status: Home / Self Care | Payer: PRIVATE HEALTH INSURANCE | Source: Ambulatory Visit | Attending: Obstetrics and Gynecology | Admitting: Obstetrics and Gynecology

## 2019-04-14 ENCOUNTER — Other Ambulatory Visit: Payer: Self-pay

## 2019-04-14 ENCOUNTER — Ambulatory Visit: Payer: PRIVATE HEALTH INSURANCE

## 2019-04-14 DIAGNOSIS — R928 Other abnormal and inconclusive findings on diagnostic imaging of breast: Secondary | ICD-10-CM

## 2019-06-08 DIAGNOSIS — M25521 Pain in right elbow: Secondary | ICD-10-CM | POA: Insufficient documentation

## 2019-08-25 ENCOUNTER — Other Ambulatory Visit: Payer: Self-pay | Admitting: Family

## 2019-08-25 DIAGNOSIS — D6851 Activated protein C resistance: Secondary | ICD-10-CM

## 2019-08-28 ENCOUNTER — Inpatient Hospital Stay: Payer: PRIVATE HEALTH INSURANCE | Attending: Family | Admitting: Family

## 2019-08-28 ENCOUNTER — Encounter: Payer: Self-pay | Admitting: Family

## 2019-08-28 ENCOUNTER — Inpatient Hospital Stay: Payer: PRIVATE HEALTH INSURANCE

## 2019-08-28 ENCOUNTER — Other Ambulatory Visit: Payer: Self-pay

## 2019-08-28 VITALS — BP 151/92 | HR 91 | Temp 97.5°F | Resp 18 | Ht 65.0 in | Wt 167.1 lb

## 2019-08-28 DIAGNOSIS — D219 Benign neoplasm of connective and other soft tissue, unspecified: Secondary | ICD-10-CM | POA: Diagnosis not present

## 2019-08-28 DIAGNOSIS — D6851 Activated protein C resistance: Secondary | ICD-10-CM | POA: Insufficient documentation

## 2019-08-28 LAB — CBC WITH DIFFERENTIAL (CANCER CENTER ONLY)
Abs Immature Granulocytes: 0.05 10*3/uL (ref 0.00–0.07)
Basophils Absolute: 0.1 10*3/uL (ref 0.0–0.1)
Basophils Relative: 1 %
Eosinophils Absolute: 0.1 10*3/uL (ref 0.0–0.5)
Eosinophils Relative: 1 %
HCT: 46.6 % — ABNORMAL HIGH (ref 36.0–46.0)
Hemoglobin: 15.3 g/dL — ABNORMAL HIGH (ref 12.0–15.0)
Immature Granulocytes: 1 %
Lymphocytes Relative: 16 %
Lymphs Abs: 1.7 10*3/uL (ref 0.7–4.0)
MCH: 28.2 pg (ref 26.0–34.0)
MCHC: 32.8 g/dL (ref 30.0–36.0)
MCV: 86 fL (ref 80.0–100.0)
Monocytes Absolute: 0.8 10*3/uL (ref 0.1–1.0)
Monocytes Relative: 8 %
Neutro Abs: 8 10*3/uL — ABNORMAL HIGH (ref 1.7–7.7)
Neutrophils Relative %: 73 %
Platelet Count: 269 10*3/uL (ref 150–400)
RBC: 5.42 MIL/uL — ABNORMAL HIGH (ref 3.87–5.11)
RDW: 13.3 % (ref 11.5–15.5)
WBC Count: 10.7 10*3/uL — ABNORMAL HIGH (ref 4.0–10.5)
nRBC: 0 % (ref 0.0–0.2)

## 2019-08-28 LAB — CMP (CANCER CENTER ONLY)
ALT: 14 U/L (ref 0–44)
AST: 16 U/L (ref 15–41)
Albumin: 4.4 g/dL (ref 3.5–5.0)
Alkaline Phosphatase: 47 U/L (ref 38–126)
Anion gap: 6 (ref 5–15)
BUN: 14 mg/dL (ref 6–20)
CO2: 30 mmol/L (ref 22–32)
Calcium: 9.5 mg/dL (ref 8.9–10.3)
Chloride: 104 mmol/L (ref 98–111)
Creatinine: 0.73 mg/dL (ref 0.44–1.00)
GFR, Est AFR Am: >60 mL/min (ref >60)
GFR, Estimated: >60 mL/min (ref >60)
Glucose, Bld: 109 mg/dL — ABNORMAL HIGH (ref 70–99)
Potassium: 4.8 mmol/L (ref 3.5–5.1)
Sodium: 140 mmol/L (ref 135–145)
Total Bilirubin: 0.5 mg/dL (ref 0.3–1.2)
Total Protein: 6.9 g/dL (ref 6.5–8.1)

## 2019-08-28 NOTE — Progress Notes (Signed)
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 She was heterozygous for the R506Q mutation. This was found while working her up for surgery. She is scheduled for full abdominoplasty with liposuction, hip liposuction and buttocks grafting.  No personal or familial history of thrombus or stroke.  She has 2 children and no history of miscarriage.  She has had a breast augmentation and partial hysterectomy in the past without any complications.  She is currently on hormone replacement therapy with progesterone, estrogen and low dose testosterone. She states that she has been told she will have to stop these for surgery and is considering stopping them all together.  She does not smoke.  No fever, chills, n/v, cough, rash, dizziness, SOB, chest pain, palpitations, abdominal pain or changes in bowel or bladder habits.  No numbness or tingling in her extremities.  She is seeing an orthopedist for her right elbow which has become progressively more swollen over the last few months. She will be having this drained and sent for testing later this week.  No falls or syncopal episodes.  She is quite active and until she started having issues with her elbow was working out regularly at home.  She has a healthy appetite and hydrates appropriately. Her weight is stable.  She stays busy working in [place] which can be stressful at times.   ROS: All other 10 point review of systems is negative.   PAST MEDICAL HISTORY:   Past Medical History:  Diagnosis Date  . SVD (spontaneous vaginal delivery)    x 2    ALLERGIES: No Known Allergies     MEDICATIONS:  Current Outpatient Medications on File Prior to Visit  Medication Sig Dispense Refill  . cholecalciferol (VITAMIN D) 1000 UNITS tablet Take 2,000 Units by mouth daily.    Marland Kitchen ibuprofen (ADVIL,MOTRIN) 200 MG tablet Take 800 mg by mouth every 6 (six) hours as needed for moderate pain.    Marland Kitchen MAGNESIUM OXIDE PO Take 1 capsule by mouth daily.    . Multiple Vitamin (MULTIVITAMIN) capsule Take 1 capsule by mouth daily.    Marland Kitchen MYRBETRIQ 25 MG TB24 tablet Take 25 mg by mouth daily.    Marland Kitchen PRESCRIPTION MEDICATION Apply 1 application topically at bedtime. Compounded hormone cream     No current facility-administered medications on file prior to visit.     PAST SURGICAL HISTORY Past Surgical History:  Procedure Laterality Date  . ABLATION     done in MD's office   . AUGMENTATION MAMMAPLASTY Bilateral 02/25/1999   saline  . BREAST SURGERY  02/1999   augmentation   . LAPAROSCOPIC ASSISTED VAGINAL HYSTERECTOMY N/A 06/26/2013   Procedure: LAPAROSCOPIC ASSISTED VAGINAL HYSTERECTOMY;  Surgeon: Marylynn Pearson, MD;  Location: [facility];  Service: Gynecology;  Laterality: N/A;  . WISDOM TOOTH EXTRACTION      FAMILY HISTORY: No family history on file.  SOCIAL HISTORY:  reports that she has never smoked. She has never used smokeless tobacco. She reports that she does not drink alcohol or use drugs.  PERFORMANCE STATUS: The patient's performance status is 0 - Asymptomatic  PHYSICAL EXAM: Most Recent Vital Signs: Last menstrual period 05/07/2013. BP (!) 151/92 (BP Location: Left Arm, Patient Position: Sitting)   Pulse 91   Temp (!) 97.5 F (36.4 C) (Temporal)   Resp 18   Ht 5\' 5"  (1.651 m)   Wt 167 lb 1.3 oz (75.8 kg)   LMP 05/07/2013   SpO2 94%   BMI 27.80 kg/m   General Appearance:    Alert, cooperative, no distress, appears stated age  Head:    Normocephalic, without obvious abnormality, atraumatic  Eyes:    PERRL, conjunctiva/corneas clear, EOM's intact, fundi    benign, both  eyes        Throat:   Lips, mucosa, and tongue normal; teeth and gums normal  Neck:   Supple, symmetrical, trachea midline, no adenopathy;    thyroid:  no enlargement/tenderness/nodules; no carotid   bruit or JVD  Back:     Symmetric, no curvature, ROM normal, no CVA tenderness  Lungs:     Clear to auscultation bilaterally, respirations unlabored  Chest Wall:    No tenderness or deformity   Heart:    Regular rate and rhythm, S1 and S2 normal, no murmur, rub   or gallop     Abdomen:     Soft, non-tender, bowel sounds active all four quadrants,    no masses, no organomegaly        Extremities:   Extremities normal, atraumatic, no cyanosis or edema  Pulses:   2+ and symmetric all extremities  Skin:   Skin color, texture, turgor normal, no rashes or lesions  Lymph nodes:   Cervical, supraclavicular, and axillary nodes normal  Neurologic:   CNII-XII intact, normal strength, sensation and reflexes    throughout    LABORATORY DATA:  Results for orders placed or performed in visit on 08/28/19 (from the past 48 hour(s))  CBC with Differential (Cancer Center Only)     Status: Abnormal   Collection Time: 08/28/19  8:46 AM  Result Value Ref Range   WBC Count 10.7 (H) 4.0 - 10.5 K/uL   RBC 5.42 (H) 3.87 - 5.11 MIL/uL   Hemoglobin 15.3 (H) 12.0 - 15.0 g/dL   HCT 46.6 (H) 36.0 - 46.0 %   MCV 86.0 80.0 - 100.0 fL   MCH 28.2 26.0 - 34.0 pg   MCHC 32.8 30.0 - 36.0 g/dL   RDW 13.3 11.5 - 15.5 %   Platelet Count 269 150 - 400 K/uL   nRBC 0.0 0.0 - 0.2 %   Neutrophils Relative % 73 %   Neutro Abs 8.0 (H) 1.7 - 7.7 K/uL   Lymphocytes Relative 16 %   Lymphs Abs 1.7 0.7 - 4.0 K/uL   Monocytes Relative 8 %   Monocytes Absolute 0.8 0.1 - 1.0 K/uL   Eosinophils Relative 1 %   Eosinophils Absolute 0.1 0.0 - 0.5 K/uL   Basophils Relative 1 %   Basophils Absolute 0.1 0.0 - 0.1 K/uL   Immature Granulocytes 1 %   Abs Immature Granulocytes 0.05 0.00 - 0.07 K/uL    Comment: Performed at [facility] at [facility], [address] [place], [place] [address]  CMP ([facility] only)     Status: Abnormal   Collection Time: 08/28/19  8:46 AM  Result Value Ref Range   Sodium 140 135 - 145 mmol/L   Potassium 4.8 3.5 - 5.1 mmol/L   Chloride 104 98 - 111 mmol/L   CO2 30 22 -  32 mmol/L   Glucose, Bld 109 (H) 70 - 99 mg/dL   BUN 14 6 - 20 mg/dL   Creatinine 0.73 0.44 - 1.00 mg/dL   Calcium 9.5 8.9 - 10.3 mg/dL   Total Protein 6.9 6.5 - 8.1 g/dL   Albumin 4.4 3.5 - 5.0 g/dL   AST 16 15 - 41 U/L   ALT 14 0 - 44 U/L   Alkaline Phosphatase 47 38 - 126 U/L   Total Bilirubin 0.5 0.3 - 1.2 mg/dL   GFR, Est Non Af Am >60 >60 mL/min   GFR, Est AFR Am >60 >60 mL/min   Anion gap 6 5 - 15    Comment: Performed at [facility] at [facility], [address], [place], [place] [address]      RADIOGRAPHY: No results found.     PATHOLOGY: None   ASSESSMENT/PLAN: Kaitlyn Hardy is a very pleasant 49 yo caucasian female with recent diagnosis of Factor V Leiden. She was heterozygous for the R506Q mutation. This was found while working her up for surgery. She is scheduled for full abdominoplasty with liposuction, hip liposuction and buttocks grafting.  Her surgeon is thorough and would like clearance from [place] prior to surgery.   She has had 2 children, no miscarriages and invasive surgeries in the past without complication.  From our standpoint she is ok to move forward with surgery.   All questions were answered. She can contact our office with any new questions or concerns. We can certainly see her again if needed.   She was discussed with and also seen by Dr. Marin Olp and he is in agreement with the aforementioned.   Laverna Peace, NP    Addendum: I saw and examined Ms. with Judson Roch.  She is very nice.  She has a heterozygote Factor V Leiden mutation.  She has never had any problems with this.  She has 2 children.  She has had no problems with  miscarriages.  She has had past surgeries without any complications.  I really would not think that she should have any issues with respect to abdominoplasty with liposuction and any additional surgery that she may need.  I do not think that she needs any type of anticoagulation in the postop setting.  I would imagine that she is going to be back on her feet pretty quickly.  She exercises.  Looks like she is in fantastic shape.  There is no obvious family history of thromboembolic disease.  She has 2 children.  They are pretty healthy.  I would think that she would need postop anticoagulation if she needs any type of orthopedic surgery, such as hip surgery or knee surgery.  Also, possibly, if she has gynecologic surgery, she would need some type of low-dose anticoagulation for a few weeks.  We spent about 45 minutes with her.  Again she is very nice.  She was fun to talk to.  She works for [employer].  I do not think we really need to see her again unless she is going to have some type of orthopedic or gynecologic surgery.  Lattie Haw, MD

## 2019-08-29 ENCOUNTER — Telehealth: Payer: Self-pay | Admitting: Hematology & Oncology

## 2019-08-29 NOTE — Telephone Encounter (Signed)
No LOS 2/1

## 2020-02-06 ENCOUNTER — Encounter: Payer: Self-pay | Admitting: Internal Medicine

## 2020-03-22 ENCOUNTER — Ambulatory Visit (AMBULATORY_SURGERY_CENTER): Payer: Self-pay | Admitting: *Deleted

## 2020-03-22 ENCOUNTER — Other Ambulatory Visit: Payer: Self-pay

## 2020-03-22 VITALS — Ht 65.0 in | Wt 166.6 lb

## 2020-03-22 DIAGNOSIS — Z1211 Encounter for screening for malignant neoplasm of colon: Secondary | ICD-10-CM

## 2020-03-22 DIAGNOSIS — Z01818 Encounter for other preprocedural examination: Secondary | ICD-10-CM

## 2020-03-22 MED ORDER — SUTAB 1479-225-188 MG PO TABS: 1.0000 | ORAL_TABLET | ORAL | 0 refills | Status: DC

## 2020-03-22 NOTE — Progress Notes (Signed)
Patient denies any allergies to egg or soy products. Patient denies complications with anesthesia/sedation.  Patient denies oxygen use at home and denies diet medications. Emmi instructions for colonoscopy explained and given to patient.  Patient's covid test is scheduled on 04/03/20 at 1:30 pm.

## 2020-04-05 ENCOUNTER — Encounter: Payer: PRIVATE HEALTH INSURANCE | Admitting: Internal Medicine

## 2020-05-21 DIAGNOSIS — D682 Hereditary deficiency of other clotting factors: Secondary | ICD-10-CM | POA: Insufficient documentation

## 2020-11-27 LAB — COLOGUARD: COLOGUARD: NEGATIVE

## 2020-11-27 LAB — EXTERNAL GENERIC LAB PROCEDURE: COLOGUARD: NEGATIVE

## 2021-09-16 NOTE — Progress Notes (Signed)
Office Visit Note  Patient: Kaitlyn Hardy             Date of Birth: 06-Dec-1970           MRN: [id number]             PCP: Madison Hickman, FNP Referring: Clancy Gourd, NP Visit Date: 09/30/2021 Occupation: @GUAROCC @  Subjective:  Pain in multiple joints  History of Present Illness: Kaitlyn Hardy is a 51 y.o. female seen in consultation per request of her PCP.  According the patient her symptoms a started about 4 years ago with a rash on her palms but she did not know what the rash was from.  About 3 years ago she started having pain in her bilateral elbows while she was working out.  She saw orthopedic surgeons multiple times and eventually developed right frozen elbow.  She states she had a steroid injection and also MRI of her elbow she was suggested exploratory surgery but the surgery was postponed.  In the meantime she saw Marella Chimes, [place] at [facility] rheumatology in March 2021 she did labs and diagnosed her with psoriatic arthritis.  By the time she also developed a rash on her shins and the rash on her palms got worse.  She was placed on Cosyntex and over the next few months her symptoms improved a lot but she developed diarrhea and after 5 months of Cosentyx use medication was discontinued.  She was switched to Humira and which she took for 4 months but her symptoms flared on Humira.  Cosentyx was restarted and her symptoms improved again except that her psoriasis got worse which she describes was on her breast in her perineal region and also lower extremities.  She was switched to [facility] in March 2022 she states [name] was very helpful for her arthritis but not so much for the skin disease.  She continues to have some rash on her hands and her bilateral shin.  She tried gluten-free diet and also dairy free diet which helped to some extent.  She was also given methotrexate for 4 months which was discontinued as it did not help her.  She states 56-month ago she developed cellulitis  on her face for which she was seen by dermatologist in [place] by the time she had an appointment the rash improved.  She continues to have some rash on her shin.  None of the Joints are painful.  She continues to have some stiffness.  She denies any history of SI joint, uveitis, plantar fasciitis or Achilles tendinitis.  Activities of Daily Living:  Patient reports morning stiffness for a few minutes.   Patient Reports nocturnal pain.  Difficulty dressing/grooming: Denies Difficulty climbing stairs: Denies Difficulty getting out of chair: Denies Difficulty using hands for taps, buttons, cutlery, and/or writing: Denies  Review of Systems  Constitutional:  Positive for fatigue.  HENT:  Negative for mouth sores, mouth dryness and nose dryness.   Eyes:  Negative for pain, itching and dryness.  Respiratory:  Negative for shortness of breath and difficulty breathing.   Cardiovascular:  Negative for chest pain and palpitations.  Gastrointestinal:  Negative for blood in stool, constipation and diarrhea.  Endocrine: Negative for increased urination.  Genitourinary:  Negative for difficulty urinating.  Musculoskeletal:  Positive for morning stiffness and muscle tenderness. Negative for joint pain, joint pain, joint swelling, myalgias and myalgias.  Skin:  Positive for rash and redness. Negative for color change.  Allergic/Immunologic: Negative for susceptible  to infections.  Neurological:  Negative for dizziness, numbness, headaches, memory loss and weakness.  Hematological:  Positive for bruising/bleeding tendency.  Psychiatric/Behavioral:  Negative for confusion.    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   Fibroid 06/26/2013    Past Medical History:  Diagnosis Date   Allergy    Arthritis    psoriasis    Factor V Leiden  ([employer])    recent  dx - single, no problems   OAB (overactive bladder)    SVD (spontaneous vaginal delivery)    x 2   Vitamin D deficiency     Family History  Problem Relation Age of Onset   Pancreatic cancer Mother    Diabetes Mother    Thyroid disease Mother    Fibromyalgia Mother    Prostate cancer Father    Healthy Son    Colon cancer Neg Hx    Rectal cancer Neg Hx    Stomach cancer Neg Hx    Past Surgical History:  Procedure Laterality Date   ABLATION     done in MD's office    AUGMENTATION MAMMAPLASTY Bilateral 02/25/1999   saline   BREAST SURGERY  02/1999   augmentation    LAPAROSCOPIC ASSISTED VAGINAL HYSTERECTOMY N/A 06/26/2013   Procedure: LAPAROSCOPIC ASSISTED VAGINAL HYSTERECTOMY;  Surgeon: Marylynn Pearson, MD;  Location: [facility];  Service: Gynecology;  Laterality: N/A;   WISDOM TOOTH EXTRACTION     Social History   Social History Narrative   Not on file    There is no immunization history on file for this patient.   Objective: Vital Signs: BP 127/86 (BP Location: Right Arm, Patient Position: Sitting, Cuff Size: Normal)    Pulse 72    Ht 5\' 4"  (1.626 m)    Wt 165 lb 3.2 oz (74.9 kg)    LMP 05/07/2013 (LMP Unknown)    BMI 28.36 kg/m    Physical Exam Vitals and nursing note reviewed.  Constitutional:      Appearance: She is well-developed.  HENT:     Head: Normocephalic and atraumatic.  Eyes:     Conjunctiva/sclera: Conjunctivae normal.  Cardiovascular:     Rate and Rhythm: Normal rate and regular rhythm.     Heart sounds: Normal heart sounds.  Pulmonary:     Effort: Pulmonary effort is normal.     Breath sounds: Normal breath sounds.  Abdominal:     General: Bowel sounds are normal.     Palpations: Abdomen is soft.  Musculoskeletal:     Cervical back: Normal range of motion.  Lymphadenopathy:     Cervical: No cervical adenopathy.  Skin:    General: Skin is warm and dry.     Capillary Refill: Capillary refill takes less than 2 seconds.      Comments: erythematous rash was noted over bilateral shin area  Neurological:     Mental Status: She is alert and oriented to person, place, and time.  Psychiatric:        Behavior: Behavior normal.     Musculoskeletal Exam: C-spine thoracic and lumbar spine were in good range of motion.  She had no SI joint tenderness.  Shoulder joints were in good range of motion.  She had about 5 degree contracture in her right elbow.  Left elbow joint was in good range of motion.  Wrist joints, MCPs and PIPs and DIPs with good range of motion.  She has synovial thickening over her left first DIP joint of the index finger.  Hip joints and knee joints in good range of motion.  She had bilateral pes cavus and hammertoes.  CDAI Exam: CDAI Score: -- Patient Global: --; Provider Global: -- Swollen: --; Tender: -- Joint Exam 09/30/2021   No joint exam has been documented for this visit   There is currently no information documented on the homunculus. Go to the Rheumatology activity and complete the homunculus joint exam.  Investigation: No additional findings.  Imaging: No results found.  Recent Labs: Lab Results  Component Value Date   WBC 10.7 (H) 08/28/2019   HGB 15.3 (H) 08/28/2019   PLT 269 08/28/2019   NA 140 08/28/2019   K 4.8 08/28/2019   CL 104 08/28/2019   CO2 30 08/28/2019   GLUCOSE 109 (H) 08/28/2019   BUN 14 08/28/2019   CREATININE 0.73 08/28/2019   BILITOT 0.5 08/28/2019   ALKPHOS 47 08/28/2019   AST 16 08/28/2019   ALT 14 08/28/2019   PROT 6.9 08/28/2019   ALBUMIN 4.4 08/28/2019   CALCIUM 9.5 08/28/2019   GFRAA >60 08/28/2019    Speciality Comments: No specialty comments available.  Procedures:  No procedures performed Allergies: Patient has no known allergies.   Assessment / Plan:     Visit Diagnoses: Psoriatic arthritis ([place]) - Dx at [facility]-09/2019 -she was diagnosed with psoriatic arthritis March 2021 by Marella Chimes.  She was a started on Cosentyx which she took  for 5 months but discontinued due to diarrhea.  She tried Humira for 4 months and discontinued due to inadequate response.  Cosentyx was again tried but her rash continued to get worse.  She was started on Taltz in March 2022.  Her psoriatic arthritis is much improved on Taltz but she continues to have rash on her shin.  She tried methotrexate for 4 months last year but she discontinued as she did not like taking injections and she thought methotrexate was not effective.  We had detailed discussion regarding different treatment options.  I discussed trying Rasuvo injections and she is willing to try it.  We will try combination of Taltz and resume over the next 3 months.  If she has an adequate response we may consider [place] or [place].  Side effects of both medications were discussed at length.  Plan: Hepatitis B core antibody, IgM, Hepatitis B surface antigen, Hepatitis C antibody, QuantiFERON-TB Gold Plus, Serum protein electrophoresis with reflex, IgG, IgA, IgM.  The plan is to start her on Rasuvo 20 mg subcu weekly along with folic acid 2 mg p.o. daily.  She has taken methotrexate in the past and has tolerated it well.  Medication counseling:  Baseline Immunosuppressant Therapy Labs TB GOLD-pending   Hepatitis Panel-pending   Immunoglobulins-pending   SPEP Serum Protein Electrophoresis Latest Ref Rng & Units 08/28/2019  Total Protein 6.5 - 8.1 g/dL 6.9     Chest Xray: Pending  Does patient have a history of inflammatory bowel disease?  No.  Patient was advised to get a colonoscopy as she has not had a colonoscopy yet.  Counseled patient that [name] is a IL-17 inhibitor that works to reduce pain and inflammation associated with arthritis.  Counseled patient on purpose, proper use, and adverse effects of Taltz. Reviewed the most common adverse effects of infection, inflammatory bowel disease, and allergic reaction. Counseled patient that [name] should be held  for infection and prior to scheduled  surgery.  Counseled patient to avoid live vaccines while on Taltz.  Advised patient to get annual influenza vaccine, pneumococcal vaccine, and Shingrix as indicated.  Reviewed storage information for Taltz.  Reviewed the importance of regular labs while on [place]. Standing orders placed and is to return in 1 month and then every 3 months after initiation.  Provided patient with medication education material and answered all questions.  Patient consented to .  Will upload consent into patient's chart.  Will apply for Taltz through patient's insurance and update when we receive a response.  Advised initial injection must be administered in office.  Patient voiced understanding.    We will continue current dose of Taltz.  Psoriasis - Pustular-palmar  High risk medication use - Taltz.  Inadequate response to humira and cosentyx. - Plan: CBC with Differential/Platelet, COMPLETE METABOLIC PANEL WITH GFR, DG Chest 2 View  Contracture of right elbow -she has 5 degree contracture in her right elbow.  No synovitis was noted.  Plan: XR Elbow 2 Views Right x-ray of the elbow joint showed mild joint space narrowing.  These findings are consistent with inflammatory arthritis.  Pain in both hands -synovial thickening and possible synovitis was noted in the left index finger DIP joint.  Plan: XR Hand 2 View Right, XR Hand 2 View Left, x-rays of bilateral hands showed osteoarthritic changes.  Sedimentation rate, Rheumatoid factor, Cyclic citrul peptide antibody, IgG  Pain in both feet -she has bilateral pes cavus and hammertoes.  No tenderness or synovitis was noted.  There was no evidence of [place] fasciitis or Achilles tendinitis.  Plan: XR Foot 2 Views Right, XR Foot 2 Views Left.  X-rays showed early osteoarthritic changes.  Pes cavus of both feet-proper fitting shoes with arch support were discussed.  I also advised her to schedule an appointment with the podiatrist for orthotics.  Seasonal  allergies  Urinary frequency  Other fatigue -she has been experiencing increased fatigue.  Plan: CK  Vitamin D deficiency -she gives history of vitamin D deficiency.  Plan: VITAMIN D 25 Hydroxy (Vit-D Deficiency, Fractures)  Orders: Orders Placed This Encounter  Procedures   XR Hand 2 View Right   XR Hand 2 View Left   XR Foot 2 Views Right   XR Foot 2 Views Left   XR Elbow 2 Views Right   DG Chest 2 View   CBC with Differential/Platelet   COMPLETE METABOLIC PANEL WITH GFR   CK   Sedimentation rate   Rheumatoid factor   Cyclic citrul peptide antibody, IgG   Hepatitis B core antibody, IgM   Hepatitis B surface antigen   Hepatitis C antibody   QuantiFERON-TB Gold Plus   Serum protein electrophoresis with reflex   IgG, IgA, IgM   VITAMIN D 25 Hydroxy (Vit-D Deficiency, Fractures)   No orders of the defined types were placed in this encounter.   Follow-Up Instructions: Return for Psoriatic arthritis.   Bo Merino, MD  Note - This record has been created using Editor, commissioning.  Chart creation errors have been sought, but may not always  have been located. Such creation errors do not reflect on  the standard of medical care.

## 2021-09-30 ENCOUNTER — Other Ambulatory Visit: Payer: Self-pay

## 2021-09-30 ENCOUNTER — Ambulatory Visit: Payer: No Typology Code available for payment source | Admitting: Rheumatology

## 2021-09-30 ENCOUNTER — Ambulatory Visit: Payer: Self-pay

## 2021-09-30 ENCOUNTER — Ambulatory Visit (HOSPITAL_COMMUNITY)
Admission: RE | Admit: 2021-09-30 | Discharge: 2021-09-30 | Disposition: A | Discharge status: Home / Self Care | Payer: No Typology Code available for payment source | Source: Ambulatory Visit | Attending: Rheumatology | Admitting: Rheumatology

## 2021-09-30 ENCOUNTER — Encounter: Payer: Self-pay | Admitting: Rheumatology

## 2021-09-30 ENCOUNTER — Telehealth: Payer: Self-pay

## 2021-09-30 ENCOUNTER — Other Ambulatory Visit (HOSPITAL_COMMUNITY): Payer: Self-pay

## 2021-09-30 VITALS — BP 127/86 | HR 72 | Ht 64.0 in | Wt 165.2 lb

## 2021-09-30 DIAGNOSIS — R350 Frequency of micturition: Secondary | ICD-10-CM

## 2021-09-30 DIAGNOSIS — J302 Other seasonal allergic rhinitis: Secondary | ICD-10-CM

## 2021-09-30 DIAGNOSIS — M24521 Contracture, right elbow: Secondary | ICD-10-CM | POA: Diagnosis not present

## 2021-09-30 DIAGNOSIS — Q6672 Congenital pes cavus, left foot: Secondary | ICD-10-CM

## 2021-09-30 DIAGNOSIS — M79671 Pain in right foot: Secondary | ICD-10-CM

## 2021-09-30 DIAGNOSIS — E559 Vitamin D deficiency, unspecified: Secondary | ICD-10-CM

## 2021-09-30 DIAGNOSIS — M79672 Pain in left foot: Secondary | ICD-10-CM

## 2021-09-30 DIAGNOSIS — M79642 Pain in left hand: Secondary | ICD-10-CM

## 2021-09-30 DIAGNOSIS — L4050 Arthropathic psoriasis, unspecified: Secondary | ICD-10-CM | POA: Diagnosis not present

## 2021-09-30 DIAGNOSIS — Z79899 Other long term (current) drug therapy: Secondary | ICD-10-CM | POA: Insufficient documentation

## 2021-09-30 DIAGNOSIS — R5383 Other fatigue: Secondary | ICD-10-CM

## 2021-09-30 DIAGNOSIS — M79641 Pain in right hand: Secondary | ICD-10-CM | POA: Diagnosis not present

## 2021-09-30 DIAGNOSIS — L409 Psoriasis, unspecified: Secondary | ICD-10-CM | POA: Diagnosis not present

## 2021-09-30 DIAGNOSIS — Q6671 Congenital pes cavus, right foot: Secondary | ICD-10-CM

## 2021-09-30 DIAGNOSIS — M25521 Pain in right elbow: Secondary | ICD-10-CM

## 2021-09-30 NOTE — Telephone Encounter (Addendum)
Attempted to submit Prior Authorization request to [place] for RASUVO '20mg'$  pens via CoverMyMeds. Eligibility check does not pop up insurance and [place] information is stating that patient is inactive ? ?Key: [id number] ? ?Saved request. Called patient and left VM for insurance information. Requested she return call to clinic with insurance information ? ?Knox Saliva, PharmD, MPH, BCPS ?Clinical Pharmacist (Rheumatology and Pulmonology) ?

## 2021-09-30 NOTE — Telephone Encounter (Signed)
Patient is currently on taltz and has transferred care to our office.  ? ?Please apply for Rasuvo per Dr. Estanislado Pandy. Thanks!  ? ?Consent obtained for both medications and sent to the scan center.  ?

## 2021-09-30 NOTE — Patient Instructions (Addendum)
Ixekizumab injection What is this medication? IXEKIZUMAB (ix e KIZ ue mab) is used to treat plaque psoriasis, psoriatic arthritis, ankylosing spondylitis, and active non-radiographic axial spondyloarthritis. This medicine may be used for other purposes; ask your health care provider or pharmacist if you have questions. COMMON BRAND NAME(S): TALTZ What should I tell my care team before I take this medication? They need to know if you have any of these conditions: immune system problems infection (especially a viral infection such as chickenpox, cold sores, or herpes) recently received or are scheduled to receive a vaccine tuberculosis, a positive skin test for tuberculosis, or have recently been in close contact with someone who has tuberculosis an unusual or allergic reaction to ixekizumab, other medicines, foods, dyes or preservatives pregnant or trying to get pregnant breast-feeding How should I use this medication? This medicine is for injection under the skin. It may be administered by a healthcare professional in a hospital or clinic setting or at home. If you get this medicine at home, you will be taught how to prepare and give this medicine. Use exactly as directed. Take your medicine at regular intervals. Do not take your medicine more often than directed. It is important that you put your used needles and syringes in a special sharps container. Do not put them in a trash can. If you do not have a sharps container, call your pharmacist or healthcare provider to get one. A special MedGuide will be given to you by the pharmacist with each prescription and refill. Be sure to read this information carefully each time. Talk to your pediatrician regarding the use of this medicine in children. While this drug may be prescribed for children as young as 6 years for selected conditions, precautions do apply. Overdosage: If you think you have taken too much of this medicine contact a poison control  center or emergency room at once. NOTE: This medicine is only for you. Do not share this medicine with others. What if I miss a dose? It is important not to miss your dose. Call your doctor of health care professional if you are unable to keep an appointment. If you give yourself the medicine and you miss a dose, take it as soon as you can. Then be sure to take your next doses on your regular schedule. Do not take double or extra doses. If you have questions about a missed injection, call your health care professional. What may interact with this medication? Do not take this medicine with any of the following medications: live virus vaccines This medicine may also interact with the following medications: inactivated vaccines This list may not describe all possible interactions. Give your health care provider a list of all the medicines, herbs, non-prescription drugs, or dietary supplements you use. Also tell them if you smoke, drink alcohol, or use illegal drugs. Some items may interact with your medicine. What should I watch for while using this medication? Tell your doctor or healthcare professional if your symptoms do not start to get better or if they get worse. You will be tested for tuberculosis (TB) before you start this medicine. If your doctor prescribes any medicine for TB, you should start taking the TB medicine before starting this medicine. Make sure to finish the full course of TB medicine. Call your doctor or healthcare professional for advice if you get a fever, chills or sore throat, or other symptoms of a cold or flu. Do not treat yourself. This drug decreases your body's ability to  fight infections. Try to avoid being around people who are sick. This medicine can decrease the response to a vaccine. If you need to get vaccinated, tell your healthcare professional if you have received this medicine within the last 6 months. Extra booster doses may be needed. Talk to your doctor to see  if a different vaccination schedule is needed. What side effects may I notice from receiving this medication? Side effects that you should report to your doctor or health care professional as soon as possible: allergic reactions like skin rash, itching or hives, swelling of the face, lips, or tongue signs and symptoms of infection like fever or chills; cough; sore throat; pain or trouble passing urine signs and symptoms of bowel problems like abdominal pain, diarrhea, blood in the stool, and weight loss white patches in the mouth or throat vaginal discharge, itching, or odor in women Side effects that usually do not require medical attention (report to your doctor or health care professional if they continue or are bothersome): nausea runny nose sinus trouble This list may not describe all possible side effects. Call your doctor for medical advice about side effects. You may report side effects to FDA at 1-800-FDA-1088. Where should I keep my medication? Keep out of the reach of children. Store the prefilled syringe or injection pen in a refrigerator between 2 to 8 degrees C (36 to 46 degrees F). Keep the syringe or the pen in the original carton until ready for use. Protect from light. Do not freeze. Do not shake. Prior to use, remove the syringe or pen from the refrigerator and use within 30 minutes. Throw away any unused medicine after the expiration date on the label. NOTE: This sheet is a summary. It may not cover all possible information. If you have questions about this medicine, talk to your doctor, pharmacist, or health care provider.  2022 Elsevier/Gold Standard (2021-04-01 00:00:00)  Methotrexate Subcutaneous Injection What is this medication? METHOTREXATE (METH oh TREX ate) treats inflammatory conditions such as arthritis or psoriasis. It works by decreasing inflammation, which can reduce pain and prevent long-term injury to the joints and skin. This medicine may be used for other  purposes; ask your health care provider or pharmacist if you have questions. COMMON BRAND NAME(S): Otrexup, Rasuvo, RediTrex What should I tell my care team before I take this medication? They need to know if you have any of these conditions: Fluid in the stomach area or lungs If you often drink alcohol Infection or immune system problems Kidney disease Liver disease Low blood counts, like low white cell, platelet, or red cell counts Lung disease Radiation therapy Stomach ulcers Ulcerative colitis An unusual or allergic reaction to methotrexate, other medications, foods, dyes, or preservatives Pregnant or trying to get pregnant Breast-feeding How should I use this medication? This medication is for injection under the skin. You will be taught how to prepare and give this medication. Refer to the Instructions for Use that come with your medication packaging. Use exactly as directed. Take your medication at regular intervals. Do not take your medication more often than directed. This medication should be taken weekly, NOT daily. It is important that you put your used needles and syringes in a special sharps container. Do not put them in a trash can. If you do not have a sharps container, call your pharmacist or care team to get one. Talk to your care team about the use of this medication in children. While this medication may be prescribed for  children as young as 2 years for selected conditions, precautions do apply. Overdosage: If you think you have taken too much of this medicine contact a poison control center or emergency room at once. NOTE: This medicine is only for you. Do not share this medicine with others. What if I miss a dose? If you are not sure if this medication was injected or if you have a hard time giving the injection, do not inject another dose. Talk with your care team. What may interact with this medication? Do not take this medication with any of the  following: Acitretin This medication may also interact with the following: Aspirin or aspirin-like medications including salicylates Azathioprine Certain antibiotics like chloramphenicol, penicillin, tetracycline Certain medications that treat or prevent blood clots like warfarin, apixaban, dabigatran, and rivaroxaban Certain medications for stomach problems like esomeprazole, omeprazole, pantoprazole Cyclosporine Dapsone Diuretics Gold Hydroxychloroquine Live virus vaccines Medications for infection like acyclovir, adefovir, amphotericin B, bacitracin, cidofovir, foscarnet, ganciclovir, gentamicin, pentamidine, vancomycin Mercaptopurine NSAIDs, medications for pain and inflammation, like ibuprofen or naproxen Other cytotoxic agents Pamidronate Pemetrexed Penicillamine Phenylbutazone Phenytoin Probenecid Retinoids such as isotretinoin and tretinoin Steroid medications like prednisone or cortisone Sulfonamides like sulfasalazine and trimethoprim/sulfamethoxazole Theophylline Zoledronic acid This list may not describe all possible interactions. Give your health care provider a list of all the medicines, herbs, non-prescription drugs, or dietary supplements you use. Also tell them if you smoke, drink alcohol, or use illegal drugs. Some items may interact with your medicine. What should I watch for while using this medication? Avoid alcoholic drinks. This medication can make you more sensitive to the sun. Keep out of the sun. If you cannot avoid being in the sun, wear protective clothing and use sunscreen. Do not use sun lamps or tanning beds/booths. You may get drowsy or dizzy. Do not drive, use machinery, or do anything that needs mental alertness until you know how this medication affects you. Do not stand or sit up quickly, especially if you are an older patient. This reduces the risk of dizzy or fainting spells. You may need blood work done while you are taking this  medication. Call your care team for advice if you get a fever, chills or sore throat, or other symptoms of a cold or flu. Do not treat yourself. This medication decreases your body's ability to fight infections. Try to avoid being around people who are sick. This medication may increase your risk to bruise or bleed. Call your care team if you notice any unusual bleeding. Be careful brushing or flossing your teeth or using a toothpick because you may get an infection or bleed more easily. If you have any dental work done, tell your dentist you are receiving this medication Check with your care team if you get an attack of severe diarrhea, nausea and vomiting, or if you sweat a lot. The loss of too much body fluid can make it dangerous for you to take this medication. Talk to your care team about your risk of cancer. You may be more at risk for certain types of cancers if you take this medication. Do not become pregnant while taking this medication or for 6 months after stopping it. Women should inform their care team if they wish to become pregnant or think they might be pregnant. Men should not father a child while taking this medication and for 3 months after stopping it. There is potential for serious harm to an unborn child. Talk to your care team for more information.  Do not breast-feed an infant while taking this medication or for 1 week after stopping it. This medication may make it more difficult to get pregnant or father a child. Talk to your care team if you are concerned about your fertility. What side effects may I notice from receiving this medication? Side effects that you should report to your care team as soon as possible: Allergic reactions--skin rash, itching, hives, swelling of the face, lips, tongue, or throat Blood clot--pain, swelling, or warmth in the leg, shortness of breath, chest pain Dry cough, shortness of breath or trouble breathing Infection--fever, chills, cough, sore  throat, wounds that don't heal, pain or trouble when passing urine, general feeling of discomfort or being unwell Kidney injury--decrease in the amount of urine, swelling of the ankles, hands, or feet Liver injury--right upper belly pain, loss of appetite, nausea, light-colored stool, dark yellow or brown urine, yellowing of the skin or eyes, unusual weakness or fatigue Low red blood cell count--unusual weakness or fatigue, dizziness, headache, trouble breathing Redness, blistering, peeling, or loosening of the skin, including inside the mouth Seizures Unusual bruising or bleeding Side effects that usually do not require medical attention (report to your care team if they continue or are bothersome): Diarrhea Dizziness Hair loss Nausea Pain, redness, or swelling with sores inside the mouth or throat Vomiting This list may not describe all possible side effects. Call your doctor for medical advice about side effects. You may report side effects to FDA at 1-800-FDA-1088. Where should I keep my medication? Keep out of the reach of children and pets. Store at room temperature between 20 and 25 degrees C (68 and 77 degrees F). Protect from light. Get rid of any unused medication after the expiration date. Talk to your care team about how to dispose of unused medicine. Special directions may apply. NOTE: This sheet is a summary. It may not cover all possible information. If you have questions about this medicine, talk to your doctor, pharmacist, or health care provider.  2022 Elsevier/Gold Standard (2020-12-12 00:00:00)   Standing Labs We placed an order today for your standing lab work.   Please have your standing labs drawn in June and every 3 months  If possible, please have your labs drawn 2 weeks prior to your appointment so that the provider can discuss your results at your appointment.  Please note that you may see your imaging and lab results in [facility] before we have reviewed  them. We may be awaiting multiple results to interpret others before contacting you. Please allow our office up to 72 hours to thoroughly review all of the results before contacting the office for clarification of your results.  We have open lab daily: Monday through Thursday from 1:30-4:30 PM and Friday from 1:30-4:00 PM at the office of Dr. Bo Merino, [facility].   Please be advised, all patients with office appointments requiring lab work will take precedent over walk-in lab work.  If possible, please come for your lab work on Monday and Friday afternoons, as you may experience shorter wait times. The office is located at [address] [place], [place], [place] [address] No appointment is necessary.   Labs are drawn by [facility]. Please bring your co-pay at the time of your lab draw.  You may receive a bill from [facility] for your lab work.  Please note if you are on Hydroxychloroquine and and an order has been placed for a Hydroxychloroquine level, you will need to have it drawn  4 hours or more after your last dose.  If you wish to have your labs drawn at another location, please call the office 24 hours in advance to send orders.  If you have any questions regarding directions or hours of operation,  please call [phone].   As a reminder, please drink plenty of water prior to coming for your lab work. Thanks!   Vaccines You are taking a medication(s) that can suppress your immune system.  The following immunizations are recommended: Flu annually Covid-19  Td/Tdap (tetanus, diphtheria, pertussis) every 10 years Pneumonia (Prevnar 15 then Pneumovax 23 at least 1 year apart.  Alternatively, can take Prevnar 20 without needing additional dose) Shingrix: 2 doses from 4 weeks to 6 months apart  Please check with your PCP to make sure you are up to date.   If you have signs or symptoms of an infection or start antibiotics: First, call your PCP for workup of your  infection. Hold your medication through the infection, until you complete your antibiotics, and until symptoms resolve if you take the following: Injectable medication (Actemra, Benlysta, Cimzia, Cosentyx, Enbrel, Humira, Kevzara, Orencia, Remicade, Simponi, Stelara, Taltz, Tremfya) Methotrexate Leflunomide (Arava) Mycophenolate (Cellcept) Morrie Sheldon, Olumiant, or Rinvoq  Heart Disease Prevention   Your inflammatory disease increases your risk of heart disease which includes heart attack, stroke, atrial fibrillation (irregular heartbeats), high blood pressure, heart failure and atherosclerosis (plaque in the arteries).  It is important to reduce your risk by:   Keep blood pressure, cholesterol, and blood sugar at healthy levels   Smoking Cessation   Maintain a healthy weight  BMI 20-25   Eat a healthy diet  Plenty of fresh fruit, vegetables, and whole grains  Limit saturated fats, foods high in sodium, and added sugars  DASH and Mediterranean diet   Increase physical activity  Recommend moderate physically activity for 150 minutes per week/ 30 minutes a day for five days a week These can be broken up into three separate ten-minute sessions during the day.   Reduce Stress  Meditation, slow breathing exercises, yoga, coloring books  Dental visits twice a year

## 2021-10-01 ENCOUNTER — Encounter: Payer: Self-pay | Admitting: Rheumatology

## 2021-10-01 DIAGNOSIS — L409 Psoriasis, unspecified: Secondary | ICD-10-CM

## 2021-10-01 NOTE — Telephone Encounter (Signed)
Patient returned call with updated rx benefits insurance information: ? ?Rx benefits are through [employer] ? ?Group: [id number] ?PCN: [id number] ?BIN: [id number] ?ID: [id number] ? ?Pharmacy phone: [phone] ?Provider services: [phone] ? ?She states she tried MTX vial and syringe for 4 months with inadequate clinical response. She confirmed that she has never tried MTX tablets. She states that at this time, she'd like to hold on starting MTX and see dermatology and review their recommendations. She is "not sold" that MTX will work any better than it did before for her psoriasis.  She states she is also switching insurance on 10/25/21 and would like to avoid trying to get authorization and having to redo process again in 3 weeks. ? ?I reviewed all of the above with Dr. Estanislado Pandy and she is in agreement ? ?Knox Saliva, PharmD, MPH, BCPS ?Clinical Pharmacist (Rheumatology and Pulmonology) ?

## 2021-10-01 NOTE — Telephone Encounter (Signed)
Please advise patient that we can refer her to Dr. Martin Majestic.  She has more open appointments and would be able to see her quickly.  Patient should request [facility].

## 2021-10-03 NOTE — Telephone Encounter (Signed)
Received a fax regarding Prior Authorization from  [employer]  for [employer]. Authorization has been DENIED because: patient must try and fail generic MTX injection. ? ?I spoke with patient earlier this week and she wanted to see dermatology for recommendations on therapy options before trying MTX again ? ?Copy of denial letter sent into patient's chart for review in future if needed ? ?Knox Saliva, PharmD, MPH, BCPS ?Clinical Pharmacist (Rheumatology and Pulmonology) ? ?

## 2021-10-04 LAB — COMPLETE METABOLIC PANEL WITH GFR
AG Ratio: 1.7 (calc) (ref 1.0–2.5)
ALT: 21 U/L (ref 6–29)
AST: 18 U/L (ref 10–35)
Albumin: 4.2 g/dL (ref 3.6–5.1)
Alkaline phosphatase (APISO): 54 U/L (ref 37–153)
BUN: 16 mg/dL (ref 7–25)
CO2: 26 mmol/L (ref 20–32)
Calcium: 9.6 mg/dL (ref 8.6–10.4)
Chloride: 103 mmol/L (ref 98–110)
Creat: 0.74 mg/dL (ref 0.50–1.03)
Globulin: 2.5 g/dL (calc) (ref 1.9–3.7)
Glucose, Bld: 91 mg/dL (ref 65–99)
Potassium: 4.7 mmol/L (ref 3.5–5.3)
Sodium: 141 mmol/L (ref 135–146)
Total Bilirubin: 0.6 mg/dL (ref 0.2–1.2)
Total Protein: 6.7 g/dL (ref 6.1–8.1)
eGFR: 98 mL/min/{1.73_m2} (ref >=60)

## 2021-10-04 LAB — IGG, IGA, IGM
IgG (Immunoglobin G), Serum: 871 mg/dL (ref 600–1640)
IgM, Serum: 102 mg/dL (ref 50–300)
Immunoglobulin A: 141 mg/dL (ref 47–310)

## 2021-10-04 LAB — CK: Total CK: 78 U/L (ref 29–143)

## 2021-10-04 LAB — PROTEIN ELECTROPHORESIS, SERUM, WITH REFLEX
Albumin ELP: 4.3 g/dL (ref 3.8–4.8)
Alpha 1: 0.3 g/dL (ref 0.2–0.3)
Alpha 2: 0.6 g/dL (ref 0.5–0.9)
Beta 2: 0.3 g/dL (ref 0.2–0.5)
Beta Globulin: 0.4 g/dL (ref 0.4–0.6)
Gamma Globulin: 0.8 g/dL (ref 0.8–1.7)
Total Protein: 6.8 g/dL (ref 6.1–8.1)

## 2021-10-04 LAB — QUANTIFERON-TB GOLD PLUS
Mitogen-NIL: >10 IU/mL
NIL: 0.04 IU/mL
QuantiFERON-TB Gold Plus: NEGATIVE
TB1-NIL: <0 IU/mL
TB2-NIL: <0 IU/mL

## 2021-10-04 LAB — CBC WITH DIFFERENTIAL/PLATELET
Absolute Monocytes: 491 cells/uL (ref 200–950)
Basophils Absolute: 88 cells/uL (ref 0–200)
Basophils Relative: 1.4 %
Eosinophils Absolute: 510 cells/uL — ABNORMAL HIGH (ref 15–500)
Eosinophils Relative: 8.1 %
HCT: 49.2 % — ABNORMAL HIGH (ref 35.0–45.0)
Hemoglobin: 16.5 g/dL — ABNORMAL HIGH (ref 11.7–15.5)
Lymphs Abs: 1928 cells/uL (ref 850–3900)
MCH: 29.4 pg (ref 27.0–33.0)
MCHC: 33.5 g/dL (ref 32.0–36.0)
MCV: 87.7 fL (ref 80.0–100.0)
MPV: 11.1 fL (ref 7.5–12.5)
Monocytes Relative: 7.8 %
Neutro Abs: 3282 cells/uL (ref 1500–7800)
Neutrophils Relative %: 52.1 %
Platelets: 218 10*3/uL (ref 140–400)
RBC: 5.61 10*6/uL — ABNORMAL HIGH (ref 3.80–5.10)
RDW: 12.2 % (ref 11.0–15.0)
Total Lymphocyte: 30.6 %
WBC: 6.3 10*3/uL (ref 3.8–10.8)

## 2021-10-04 LAB — RHEUMATOID FACTOR: Rheumatoid fact SerPl-aCnc: <14 IU/mL (ref <14)

## 2021-10-04 LAB — VITAMIN D 25 HYDROXY (VIT D DEFICIENCY, FRACTURES): Vit D, 25-Hydroxy: 44 ng/mL (ref 30–100)

## 2021-10-04 LAB — SEDIMENTATION RATE: Sed Rate: 11 mm/h (ref 0–30)

## 2021-10-04 LAB — CYCLIC CITRUL PEPTIDE ANTIBODY, IGG: Cyclic Citrullin Peptide Ab: <16 UNITS

## 2021-10-04 LAB — HEPATITIS C ANTIBODY
Hepatitis C Ab: NONREACTIVE
SIGNAL TO CUT-OFF: 0.02 (ref <1.00)

## 2021-10-04 LAB — HEPATITIS B CORE ANTIBODY, IGM: Hep B C IgM: NONREACTIVE

## 2021-10-04 LAB — HEPATITIS B SURFACE ANTIGEN: Hepatitis B Surface Ag: NONREACTIVE

## 2021-10-05 NOTE — Progress Notes (Signed)
I will discuss results at the follow-up visit.

## 2021-10-07 ENCOUNTER — Other Ambulatory Visit: Payer: Self-pay | Admitting: Rheumatology

## 2021-10-07 MED ORDER — CALCIPOTRIENE 0.005 % EX OINT: TOPICAL_OINTMENT | Freq: Two times a day (BID) | CUTANEOUS | 0 refills | Status: DC

## 2021-10-07 NOTE — Telephone Encounter (Signed)
Sent a prescription for Dovonex ointment which can be applied to her hands twice a day.  Please advise her to not to touch her eyes when she has ointment on her hands.

## 2021-10-08 NOTE — Progress Notes (Signed)
Office Visit Note  Patient: Kaitlyn Hardy             Date of Birth: 1970-10-31           MRN: [id number]             PCP: Jerrye Bushy, FNP Referring: Hal Morales, NP Visit Date: 10/22/2021 Occupation: @GUAROCC @  Subjective:  Medication management  History of Present Illness: Kaitlyn Hardy is a 51 y.o. female with history of psoriatic arthritis, osteoarthritis and psoriasis.  She states her psoriatic arthritis is well controlled on Taltz.  She continues to have some stiffness in her joints due to underlying osteoarthritis.  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  She also exercises on a regular basis.  She continues to have some psoriasis on her right thumb and bilateral shins.  She has been using clobetasol.  She has not tried Dovonox ointment yet.  Activities of Daily Living:  Patient reports morning stiffness for 24 hours.   Patient Reports nocturnal pain.  Difficulty dressing/grooming: Denies Difficulty climbing stairs: Denies Difficulty getting out of chair: Denies Difficulty using hands for taps, buttons, cutlery, and/or writing: Denies  Review of Systems  Constitutional:  Negative for fatigue.  HENT:  Positive for mouth dryness.   Eyes:  Negative for dryness.  Respiratory:  Negative for shortness of breath.   Cardiovascular:  Negative for swelling in legs/feet.  Gastrointestinal:  Negative for constipation.  Endocrine: Positive for increased urination.  Genitourinary:  Negative for difficulty urinating.  Musculoskeletal:  Positive for joint pain, joint pain and morning stiffness.  Skin:  Negative for rash.  Allergic/Immunologic: Negative for susceptible to infections.  Neurological:  Negative for numbness.  Hematological:  Positive for bruising/bleeding tendency.  Psychiatric/Behavioral:  Negative for sleep disturbance.    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   Fibroid 06/26/2013    Past Medical History:  Diagnosis Date   Allergy    Arthritis    psoriasis    Factor V Leiden (HCC)    recent  dx - single, no problems   OAB (overactive bladder)    SVD (spontaneous vaginal delivery)    x 2   Vitamin D deficiency     Family History  Problem Relation Age of Onset   Pancreatic cancer Mother    Diabetes Mother    Thyroid disease Mother    Fibromyalgia Mother    Prostate cancer Father    Healthy Son    Colon cancer Neg Hx    Rectal cancer Neg Hx    Stomach cancer Neg Hx    Past Surgical History:  Procedure Laterality Date   ABLATION     done in MD's office    AUGMENTATION MAMMAPLASTY Bilateral 02/25/1999   saline   BREAST SURGERY  02/1999   augmentation    LAPAROSCOPIC ASSISTED VAGINAL HYSTERECTOMY N/A 06/26/2013   Procedure: LAPAROSCOPIC ASSISTED VAGINAL HYSTERECTOMY;  Surgeon: Zelphia Cairo, MD;  Location: [facility];  Service: Gynecology;  Laterality: N/A;   WISDOM TOOTH EXTRACTION     Social History   Social History Narrative   Not on file    There is no immunization history on file for this patient.   Objective: Vital Signs: BP 121/84 (BP Location: Left Arm, Patient Position: Sitting, Cuff Size: Normal)  Pulse 70   Resp 14   Ht 5\' 5"  (1.651 m)   Wt 164 lb (74.4 kg)   LMP 05/07/2013 (LMP Unknown)   BMI 27.29 kg/m    Physical Exam Vitals and nursing note reviewed.  Constitutional:      Appearance: She is well-developed.  HENT:     Head: Normocephalic and atraumatic.  Eyes:     Conjunctiva/sclera: Conjunctivae normal.  Cardiovascular:     Rate and Rhythm: Normal rate and regular rhythm.     Heart sounds: Normal heart sounds.  Pulmonary:     Effort: Pulmonary effort is normal.     Breath sounds: Normal breath sounds.  Abdominal:     General: Bowel sounds are normal.     Palpations: Abdomen is soft.  Musculoskeletal:     Cervical  back: Normal range of motion.  Lymphadenopathy:     Cervical: No cervical adenopathy.  Skin:    General: Skin is warm and dry.     Capillary Refill: Capillary refill takes less than 2 seconds.     Comments: Dry scale was noted on her right thumb.  Erythema was noted over bilateral shin area.  Neurological:     Mental Status: She is alert and oriented to person, place, and time.  Psychiatric:        Behavior: Behavior normal.     Musculoskeletal Exam: C-spine was in good range of motion.  Thoracic and lumbar spine was in good range of motion.  She had no tenderness over SI joints.  Shoulder joints, left elbow joint, wrist joints, MCPs PIPs and DIPs with good range of motion with no synovitis.  Right elbow contracture with no synovitis was noted.  Hip joints, knee joints with good range of motion.  There was no tenderness over ankles or MTPs.  CDAI Exam: CDAI Score: -- Patient Global: --; Provider Global: -- Swollen: --; Tender: -- Joint Exam 10/22/2021   No joint exam has been documented for this visit   There is currently no information documented on the homunculus. Go to the Rheumatology activity and complete the homunculus joint exam.  Investigation: No additional findings.  Imaging: DG Chest 2 View  Result Date: 09/30/2021 CLINICAL DATA:  Immunosuppressive therapy. Screening for high-risk medication. Psoriatic arthritis. EXAM: CHEST - 2 VIEW COMPARISON:  None. FINDINGS: Cardiac silhouette and mediastinal contours are within normal limits. The lungs are clear. No pleural effusion or pneumothorax. Moderate anterior approximate T10-11 disc space narrowing and anterior bridging osteophytosis. IMPRESSION: No active cardiopulmonary disease. Electronically Signed   By: Neita Garnet M.D.   On: 09/30/2021 11:17   XR Elbow 2 Views Right  Result Date: 09/30/2021 Elbow joint narrowing was noted.  No erosive changes were noted. 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  XR Foot 2 Views Left  Result Date: 09/30/2021 Mild PIP and DIP narrowing was noted.  No MTP, intertarsal or tibiotalar joint space narrowing was noted.  Posterior calcaneal spur was noted.  No erosive changes were noted. Impression: These findings are consistent with osteoarthritis of the foot.  XR Foot 2 Views Right  Result Date: 09/30/2021 PIP and DIP narrowing was noted.  No MTP, intertarsal or tibiotalar joint space narrowing was noted.  No erosive changes were noted. Impression: These findings are consistent with osteoarthritis of the foot.  XR Hand 2 View Left  Result Date: 09/30/2021 CMC narrowing and subluxation was noted.  PIP and DIP narrowing was noted.  No MCP, intercarpal  or radiocarpal joint space narrowing was noted. Impression: These findings are consistent with osteoarthritis of the hand.  XR Hand 2 View Right  Result Date: 09/30/2021 PIP and DIP narrowing was noted.  No MCP, intercarpal or radiocarpal joint space narrowing was noted.  No erosive changes were noted. Impression: X-rays were consistent with osteoarthritis of the hand.   Recent Labs: Lab Results  Component Value Date   WBC 6.3 09/30/2021   HGB 16.5 (H) 09/30/2021   PLT 218 09/30/2021   NA 141 09/30/2021   K 4.7 09/30/2021   CL 103 09/30/2021   CO2 26 09/30/2021   GLUCOSE 91 09/30/2021   BUN 16 09/30/2021   CREATININE 0.74 09/30/2021   BILITOT 0.6 09/30/2021   ALKPHOS 47 08/28/2019   AST 18 09/30/2021   ALT 21 09/30/2021   PROT 6.7 09/30/2021   PROT 6.8 09/30/2021   ALBUMIN 4.4 08/28/2019   CALCIUM 9.6 09/30/2021   GFRAA >60 08/28/2019   QFTBGOLDPLUS NEGATIVE 09/30/2021   09/30/2020 SPEP normal,Igs normal, TB GOLD neg, Hep B -, Hep C-, ESR 11,CK 78, RF- , CCP Ab-, Vit D 44  Speciality Comments: Cosentyx-diarrhea, Hummira-inadequate response,  Procedures:  No procedures performed Allergies: Patient has no known allergies.   Assessment / Plan:     Visit Diagnoses:  Psoriatic arthritis (HCC) - Dx at [facility]-09/2019.  She has been doing well on Taltz 80 mg subcu every 28 days.  She had no synovitis on examination today.  She continues to have some stiffness in her joints due to underlying osteoarthritis.  She had been treated with methotrexate in the past which caused fatigue.  She had diarrhea on Cosentyx and an adequate response to Humira.    Psoriasis - pustular palmar psorisis she continues to have some psoriasis on her right thumb and bilateral shins.  She has a prescription for clobetasol which she uses as needed.  She has not tried Dovonex ointment.  I advised her to try it.  Topical moisturizers were also discussed.  She has an appointment coming up with dermatology at the summer.  High risk medication use - Taltz 80 mg sq q 28 days.  Labs obtained on September 30, 2021 were reviewed.  Her hemoglobin was elevated at 16.5.  We will continue to monitor.  CMP was normal.  CK, vitamin D were normal.  TB gold was negative.  Rheumatoid factor and anti-CCP were negative.  SPEP, hepatitis B, hepatitis C were negative.  Lab values were discussed with the patient.  She was advised to hold Taltz in case she develops an infection and resume after the infection resolves.  Information on immunization was placed in the AVS.  Use of sunscreen was advised.  Primary osteoarthritis of both hands -bilateral DIP and PIP thickening was noted.  No synovitis was noted today.  X-ray findings were consistent with osteoarthritis.  X-ray findings were reviewed with the patient.  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  X-rays showed mild joint space narrowing.  X-ray findings were reviewed with the patient.  Primary osteoarthritis of both feet - Bilateral hammertoes were noted.  X-rays were consistent with osteoarthritis.  X-ray findings were reviewed with the patient.  There was no evidence of Achilles tendinitis of [place] fasciitis.  Pes cavus of both feet - Arch support  were advised.  She was advised to schedule an appointment with a podiatrist for orthotics.  Vitamin D deficiency-her vitamin D level was normal.  Other fatigue  Seasonal allergies  Urinary frequency  Orders: No orders of the defined types were placed in this encounter.  No orders of the defined types were placed in this encounter.    Follow-Up Instructions: Return in about 3 months (around 01/22/2022) for Psoriatic arthritis, Osteoarthritis.   Pollyann Savoy, MD  Note - This record has been created using Animal nutritionist.  Chart creation errors have been sought, but may not always  have been located. Such creation errors do not reflect on  the standard of medical care.

## 2021-10-22 ENCOUNTER — Ambulatory Visit (INDEPENDENT_AMBULATORY_CARE_PROVIDER_SITE_OTHER): Payer: No Typology Code available for payment source | Admitting: Rheumatology

## 2021-10-22 ENCOUNTER — Encounter: Payer: Self-pay | Admitting: Rheumatology

## 2021-10-22 VITALS — BP 121/84 | HR 70 | Resp 14 | Ht 65.0 in | Wt 164.0 lb

## 2021-10-22 DIAGNOSIS — M19071 Primary osteoarthritis, right ankle and foot: Secondary | ICD-10-CM

## 2021-10-22 DIAGNOSIS — Q6672 Congenital pes cavus, left foot: Secondary | ICD-10-CM

## 2021-10-22 DIAGNOSIS — M19042 Primary osteoarthritis, left hand: Secondary | ICD-10-CM

## 2021-10-22 DIAGNOSIS — M19041 Primary osteoarthritis, right hand: Secondary | ICD-10-CM

## 2021-10-22 DIAGNOSIS — J302 Other seasonal allergic rhinitis: Secondary | ICD-10-CM

## 2021-10-22 DIAGNOSIS — L4050 Arthropathic psoriasis, unspecified: Secondary | ICD-10-CM

## 2021-10-22 DIAGNOSIS — Z79899 Other long term (current) drug therapy: Secondary | ICD-10-CM | POA: Diagnosis not present

## 2021-10-22 DIAGNOSIS — R350 Frequency of micturition: Secondary | ICD-10-CM

## 2021-10-22 DIAGNOSIS — L409 Psoriasis, unspecified: Secondary | ICD-10-CM

## 2021-10-22 DIAGNOSIS — M19072 Primary osteoarthritis, left ankle and foot: Secondary | ICD-10-CM

## 2021-10-22 DIAGNOSIS — Q6671 Congenital pes cavus, right foot: Secondary | ICD-10-CM

## 2021-10-22 DIAGNOSIS — E559 Vitamin D deficiency, unspecified: Secondary | ICD-10-CM

## 2021-10-22 DIAGNOSIS — R5383 Other fatigue: Secondary | ICD-10-CM

## 2021-10-22 DIAGNOSIS — M24521 Contracture, right elbow: Secondary | ICD-10-CM

## 2021-10-22 NOTE — Patient Instructions (Signed)
Standing Labs ?We placed an order today for your standing lab work.  ? ?Please have your standing labs drawn in June and every 3 months ? ?If possible, please have your labs drawn 2 weeks prior to your appointment so that the provider can discuss your results at your appointment. ? ?Please note that you may see your imaging and lab results in MyChart before we have reviewed them. ?We may be awaiting multiple results to interpret others before contacting you. ?Please allow our office up to 72 hours to thoroughly review all of the results before contacting the office for clarification of your results. ? ?We have open lab daily: ?Monday through Thursday from 1:30-4:30 PM and Friday from 1:30-4:00 PM ?at the office of Dr. Alver Leete, [facility].   ?Please be advised, all patients with office appointments requiring lab work will take precedent over walk-in lab work.  ?If possible, please come for your lab work on Monday and Friday afternoons, as you may experience shorter wait times. ?The office is located at [address], [address], [place], [place] [address] ?No appointment is necessary.   ?Labs are drawn by [facility]. Please bring your co-pay at the time of your lab draw.  You may receive a bill from [facility] for your lab work. ? ?Please note if you are on Hydroxychloroquine and and an order has been placed for a Hydroxychloroquine level, you will need to have it drawn 4 hours or more after your last dose. ? ?If you wish to have your labs drawn at another location, please call the office 24 hours in advance to send orders. ? ?If you have any questions regarding directions or hours of operation,  ?please call [phone].   ?As a reminder, please drink plenty of water prior to coming for your lab work. Thanks!  ? ?Vaccines ?You are taking a medication(s) that can suppress your immune system.  The following immunizations are recommended: ?Flu annually ?Covid-19  ?Td/Tdap (tetanus, diphtheria, pertussis)  every 10 years ?Pneumonia (Prevnar 15 then Pneumovax 23 at least 1 year apart.  Alternatively, can take Prevnar 20 without needing additional dose) ?Shingrix: 2 doses from 4 weeks to 6 months apart ? ?Please check with your PCP to make sure you are up to date.  ? ?If you have signs or symptoms of an infection or start antibiotics: ?First, call your PCP for workup of your infection. ?Hold your medication through the infection, until you complete your antibiotics, and until symptoms resolve if you take the following: ?Injectable medication (Actemra, Benlysta, Cimzia, Cosentyx, Enbrel, Humira, Kevzara, Orencia, Remicade, Simponi, Stelara, Taltz, Tremfya) ?Methotrexate ?Leflunomide (Arava) ?Mycophenolate (Cellcept) ?Xeljanz, Olumiant, or Rinvoq  ?

## 2021-10-27 DIAGNOSIS — N951 Menopausal and female climacteric states: Secondary | ICD-10-CM | POA: Diagnosis not present

## 2021-10-30 ENCOUNTER — Telehealth: Payer: Self-pay | Admitting: Rheumatology

## 2021-10-30 DIAGNOSIS — L4050 Arthropathic psoriasis, unspecified: Secondary | ICD-10-CM

## 2021-10-30 DIAGNOSIS — Z79899 Other long term (current) drug therapy: Secondary | ICD-10-CM

## 2021-10-30 DIAGNOSIS — L409 Psoriasis, unspecified: Secondary | ICD-10-CM

## 2021-10-30 NOTE — Telephone Encounter (Signed)
Submitted a Prior Authorization request to [facility] for TALTZ via CoverMyMeds. Will update once we receive a response. ? ?Key: [id number]  ? ?Knox Saliva, PharmD, MPH, BCPS ?Clinical Pharmacist (Rheumatology and Pulmonology) ?

## 2021-10-30 NOTE — Telephone Encounter (Signed)
Patient called the office stating her insurance changed and she wants to make sure her Kaitlyn Hardy would be covered and if not she would have to fill out an assistance form again. Patient states last time it took a long time to get approved and wanted to start it before she runs out of medication.  ?

## 2021-11-03 ENCOUNTER — Other Ambulatory Visit (HOSPITAL_COMMUNITY): Payer: Self-pay

## 2021-11-03 MED ORDER — TALTZ 80 MG/ML ~~LOC~~ SOAJ: 80.0000 mg | SUBCUTANEOUS | 2 refills | Status: DC

## 2021-11-03 NOTE — Addendum Note (Signed)
Addended by: Cassandria Anger on: 11/03/2021 12:07 PM ? ? Modules accepted: Orders ? ?

## 2021-11-03 NOTE — Telephone Encounter (Signed)
Received notification from [facility] regarding a prior authorization for [place]. Authorization has been APPROVED from 11/02/21 to 11/03/22.  ? ?Unable to run test claim as patient must fill through [facility]: [phone] ? ?Authorization #  [id number] ? ?Called patient and enrolled into copay card program with free sharps box delivery: ? ?RXBIN: [id number] ?PCN: [id number] ?GRP: [id number] ?ID: [id number] ?Expiration Date: 07/26/2024 ? ?I advised patient to call pharmacy 2 weeks before she is due for Taltz injection to get set up and provide pharmacy copay card information. Pharmacy information and copay card information provided to patient via MyChart since she was driving. ? ?Knox Saliva, PharmD, MPH, BCPS ?Clinical Pharmacist (Rheumatology and Pulmonology) ? ?

## 2022-01-08 NOTE — Progress Notes (Signed)
Office Visit Note  Patient: Kaitlyn Hardy             Date of Birth: 04-Feb-1971           MRN: [id number]             PCP: Madison Hickman, FNP Referring: Madison Hickman, FNP Visit Date: 01/21/2022 Occupation: '@GUAROCC'$ @  Subjective:  Medication monitoring   History of Present Illness: Kaitlyn Hardy is a 51 y.o. female with history of psoriatic arthritis and osteoarthritis.  She is currently on taltz 80 mg sq injections every 28 days.  She is tolerating taltz without any side effects or injection site reactions. She has not missed any doses of taltz recently.  She has intermittent pain in the left elbow and left thumb but denies any joint swelling.  She has intermittent stiffness in both achilles tendons.  She denies any plantar fasciitis.  She denies any SI joint pain. She take ibuprofen 800 mg daily for pain relief.  She denies any new patches of psoriasis. She continues to have a few pustular psoriasis lesions on her hands and hyperpigmentation on both lower extremities.  She has not been using clobetasol recently but has tried coconut oil topically.  She denies any new medical conditions.   She denies any recent infections.  She does not want to add any medications on as combination therapy at this time.     Activities of Daily Living:  Patient reports morning stiffness for 2 minutes.   Patient Denies nocturnal pain.  Difficulty dressing/grooming: Denies Difficulty climbing stairs: Denies Difficulty getting out of chair: Denies Difficulty using hands for taps, buttons, cutlery, and/or writing: Denies  Review of Systems  Constitutional:  Positive for fatigue.  HENT:  Positive for mouth dryness.   Eyes:  Positive for dryness.  Respiratory:  Negative for shortness of breath.   Cardiovascular:  Positive for swelling in legs/feet.  Gastrointestinal:  Negative for constipation.  Endocrine: Negative for excessive thirst.  Genitourinary:  Negative for difficulty urinating.   Musculoskeletal:  Positive for joint pain, joint pain and morning stiffness.  Skin:  Positive for rash.  Allergic/Immunologic: Negative for susceptible to infections.  Neurological:  Negative for numbness.  Hematological:  Negative for bruising/bleeding tendency.  Psychiatric/Behavioral:  Negative for sleep disturbance.     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   Fibroid 06/26/2013    Past Medical History:  Diagnosis Date   Allergy    Arthritis    psoriasis    Factor V Leiden ([place])    recent  dx - single, no problems   OAB (overactive bladder)    SVD (spontaneous vaginal delivery)    x 2   Vitamin D deficiency     Family History  Problem Relation Age of Onset   Pancreatic cancer Mother    Diabetes Mother    Thyroid disease Mother    Fibromyalgia Mother    Prostate cancer Father    Healthy Son    Colon cancer Neg Hx    Rectal cancer Neg Hx    Stomach cancer Neg Hx    Past Surgical History:  Procedure Laterality Date   ABLATION     done in MD's office    AUGMENTATION MAMMAPLASTY Bilateral 02/25/1999   saline   BREAST SURGERY  02/1999  augmentation    LAPAROSCOPIC ASSISTED VAGINAL HYSTERECTOMY N/A 06/26/2013   Procedure: LAPAROSCOPIC ASSISTED VAGINAL HYSTERECTOMY;  Surgeon: Marylynn Pearson, MD;  Location: [facility];  Service: Gynecology;  Laterality: N/A;   WISDOM TOOTH EXTRACTION     Social History   Social History Narrative   Not on file    There is no immunization history on file for this patient.   Objective: Vital Signs: BP 130/81 (BP Location: Right Arm, Patient Position: Sitting, Cuff Size: Normal)   Pulse 65   Resp 15   Ht '5\' 4"'$  (1.626 m)   Wt 161 lb (73 kg)   LMP 05/07/2013 (LMP Unknown)   BMI 27.64 kg/m    Physical Exam Vitals and nursing note reviewed.  Constitutional:       Appearance: She is well-developed.  HENT:     Head: Normocephalic and atraumatic.  Eyes:     Conjunctiva/sclera: Conjunctivae normal.  Cardiovascular:     Rate and Rhythm: Normal rate and regular rhythm.     Heart sounds: Normal heart sounds.  Pulmonary:     Effort: Pulmonary effort is normal.     Breath sounds: Normal breath sounds.  Abdominal:     General: Bowel sounds are normal.     Palpations: Abdomen is soft.  Musculoskeletal:     Cervical back: Normal range of motion.  Skin:    General: Skin is warm and dry.     Capillary Refill: Capillary refill takes less than 2 seconds.     Comments: Erythema noted on anterior aspect of both shins.  Dry scaling noted on right thumb.  Neurological:     Mental Status: She is alert and oriented to person, place, and time.  Psychiatric:        Behavior: Behavior normal.      Musculoskeletal Exam: C-spine has good range of motion with no discomfort.  No midline spinal tenderness.  No SI joint tenderness.  Shoulder joints have good range of motion with no discomfort.  Right elbow joint contracture unchanged.  No tenderness or inflammation along the elbow joint lines. Wrist joints, MCPs, PIPs, DIPs have good range of motion with no synovitis.  Hip joints have good range of motion with no groin pain.  Knee joints have good range of motion with no warmth or effusion.  Ankle joints have good range of motion with no tenderness or joint swelling.  Some tenderness along the Achilles tendon bilaterally.  No evidence of plantar fasciitis.  No tenderness over MTP joints.  CDAI Exam: CDAI Score: -- Patient Global: --; Provider Global: -- Swollen: --; Tender: -- Joint Exam 01/21/2022   No joint exam has been documented for this visit   There is currently no information documented on the homunculus. Go to the Rheumatology activity and complete the homunculus joint exam.  Investigation: No additional findings.  Imaging: No results found.  Recent  Labs: Lab Results  Component Value Date   WBC 6.3 09/30/2021   HGB 16.5 (H) 09/30/2021   PLT 218 09/30/2021   NA 141 09/30/2021   K 4.7 09/30/2021   CL 103 09/30/2021   CO2 26 09/30/2021   GLUCOSE 91 09/30/2021   BUN 16 09/30/2021   CREATININE 0.74 09/30/2021   BILITOT 0.6 09/30/2021   ALKPHOS 47 08/28/2019   AST 18 09/30/2021   ALT 21 09/30/2021   PROT 6.7 09/30/2021   PROT 6.8 09/30/2021   ALBUMIN 4.4 08/28/2019   CALCIUM 9.6 09/30/2021   GFRAA >60 08/28/2019  QFTBGOLDPLUS NEGATIVE 09/30/2021    Speciality Comments: MTX-fatigue,Cosentyx-diarrhea, Humira-inadequate response,  Procedures:  No procedures performed Allergies: Patient has no known allergies.   Assessment / Plan:     Visit Diagnoses: Psoriatic arthritis ([place]) - Dx at [facility]-09/2019: She has no synovitis or dactylitis on examination today.  She has not had any signs or symptoms of a sort of arthritis flare.  She has occasional pain and stiffness in the left elbow and left first MCP joint but has not noticed any joint swelling.  She has some stiffness and discomfort in the Achilles tendons bilaterally.  She has no evidence of plantar fasciitis on examination today.  No SI joint tenderness upon palpation.  Overall her psoriatic arthritis appears well controlled on Taltz 80 mg subcu days injections every 28 days.  She has been tolerating Taltz without any side effects and has not missed any doses recently.  She has not had any recent or recurrent infections.  She is apprehensive to try combination therapy for better management of psoriasis at this time.  She plans on using coconut oil topically as needed.  No medication changes will be made at this time.  She was advised to notify us if she develops increased joint pain or joint swelling.  She will follow-up in the office in 3 months or sooner if needed.  Psoriasis - Pustular palmar psoriasis-right thumb.  Hyperpigmentation on anterior surface of both shins.  No scaling  noted. She will remain on taltz as prescribed.  She is apprehensive to try combination therapy at this time.  She plans on using coconut oil topically as needed.  She was advised to notify us if she develops any new patches.  High risk medication use -  Taltz 80 mg sq q 28 days.  Initiated in March 2022.  Previous therapy includes humira, MTX, and cosentyx. - Plan: CBC with Differential/Platelet, COMPLETE METABOLIC PANEL WITH GFR CBC and CMP updated on 09/30/21.  She is due to update lab work today. Orders released.  Her next lab work will be due in September and every 3 months. Standing orders for CBC and CMP remain in place. TB gold negative on 09/30/21.  She has not had any recent infections. Discussed the importance of holding taltz if she develops signs or symptoms of an infection and to resume once the infection has completely cleared.     Reviewed the indications, contraindications, and potential side effects of taltz today.  All questions were addressed.   Primary osteoarthritis of both hands: She has PIP and DIP thickening consistent with osteoarthritis of both hands.  Some tenderness of the left 1st MCP joint but no synovitis or dactylitis noted.  Complete fist formation bilaterally.   Contracture of right elbow: Mild joint space narrowing on previous XR.  No tenderness or synovitis on examination today.  Primary osteoarthritis of both feet: She is not experiencing any discomfort in his feet at this time.  Ankle joints have good ROM with no tenderness or inflammation. No tenderness over MTP joints.   Pes cavus of both feet: She is wearing proper fitting shoes.   Vitamin D deficiency: She is taking vitamin D 2000 units every other day.   Other medical conditions are listed as follows:   Other fatigue: Stable.   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   Orders: Orders Placed This Encounter  Procedures   CBC with Differential/Platelet   COMPLETE METABOLIC  PANEL WITH GFR   No orders of  the defined types were placed in this encounter.    Follow-Up Instructions: Return in about 3 months (around 04/23/2022) for Psoriatic arthritis, Osteoarthritis.   Ofilia Neas, PA-C  Note - This record has been created using Dragon software.  Chart creation errors have been sought, but may not always  have been located. Such creation errors do not reflect on  the standard of medical care.

## 2022-01-12 ENCOUNTER — Other Ambulatory Visit: Payer: Self-pay | Admitting: Rheumatology

## 2022-01-12 DIAGNOSIS — Z79899 Other long term (current) drug therapy: Secondary | ICD-10-CM

## 2022-01-12 DIAGNOSIS — L4050 Arthropathic psoriasis, unspecified: Secondary | ICD-10-CM

## 2022-01-12 DIAGNOSIS — L409 Psoriasis, unspecified: Secondary | ICD-10-CM

## 2022-01-12 NOTE — Telephone Encounter (Signed)
Next Visit: 01/21/2022  Last Visit: 10/22/2021  Last Fill: 11/03/2021  DX: Psoriatic arthritis   Current Dose per office note 10/22/2021: Taltz 80 mg sq q 28 days  Labs: 09/30/2021, RBC 5.61, Hemoglobin 16.5, HCT 49.2, Eosinophils Absolute 510  TB Gold: 09/30/2021, negative   Okay to refill Taltz?

## 2022-01-21 ENCOUNTER — Ambulatory Visit: Payer: 59 | Admitting: Physician Assistant

## 2022-01-21 ENCOUNTER — Encounter: Payer: Self-pay | Admitting: Physician Assistant

## 2022-01-21 VITALS — BP 130/81 | HR 65 | Resp 15 | Ht 64.0 in | Wt 161.0 lb

## 2022-01-21 DIAGNOSIS — L409 Psoriasis, unspecified: Secondary | ICD-10-CM

## 2022-01-21 DIAGNOSIS — R5383 Other fatigue: Secondary | ICD-10-CM | POA: Diagnosis not present

## 2022-01-21 DIAGNOSIS — Q6671 Congenital pes cavus, right foot: Secondary | ICD-10-CM

## 2022-01-21 DIAGNOSIS — Z79899 Other long term (current) drug therapy: Secondary | ICD-10-CM

## 2022-01-21 DIAGNOSIS — D6851 Activated protein C resistance: Secondary | ICD-10-CM

## 2022-01-21 DIAGNOSIS — J302 Other seasonal allergic rhinitis: Secondary | ICD-10-CM

## 2022-01-21 DIAGNOSIS — M19072 Primary osteoarthritis, left ankle and foot: Secondary | ICD-10-CM

## 2022-01-21 DIAGNOSIS — M24521 Contracture, right elbow: Secondary | ICD-10-CM

## 2022-01-21 DIAGNOSIS — M19071 Primary osteoarthritis, right ankle and foot: Secondary | ICD-10-CM | POA: Diagnosis not present

## 2022-01-21 DIAGNOSIS — E559 Vitamin D deficiency, unspecified: Secondary | ICD-10-CM

## 2022-01-21 DIAGNOSIS — L4050 Arthropathic psoriasis, unspecified: Secondary | ICD-10-CM

## 2022-01-21 DIAGNOSIS — Q6672 Congenital pes cavus, left foot: Secondary | ICD-10-CM

## 2022-01-21 DIAGNOSIS — M19041 Primary osteoarthritis, right hand: Secondary | ICD-10-CM

## 2022-01-21 DIAGNOSIS — M19042 Primary osteoarthritis, left hand: Secondary | ICD-10-CM

## 2022-01-21 NOTE — Patient Instructions (Signed)
Standing Labs We placed an order today for your standing lab work.   Please have your standing labs drawn in September and every 3 months   If possible, please have your labs drawn 2 weeks prior to your appointment so that the provider can discuss your results at your appointment.  Please note that you may see your imaging and lab results in MyChart before we have reviewed them. We may be awaiting multiple results to interpret others before contacting you. Please allow our office up to 72 hours to thoroughly review all of the results before contacting the office for clarification of your results.  We have open lab daily: Monday through Thursday from 1:30-4:30 PM and Friday from 1:30-4:00 PM at the office of Dr. Shaili Deveshwar, [facility].   Please be advised, all patients with office appointments requiring lab work will take precedent over walk-in lab work.  If possible, please come for your lab work on Monday and Friday afternoons, as you may experience shorter wait times. The office is located at [address], [address], [place], [place] [address] No appointment is necessary.   Labs are drawn by [facility]. Please bring your co-pay at the time of your lab draw.  You may receive a bill from [facility] for your lab work.  Please note if you are on Hydroxychloroquine and and an order has been placed for a Hydroxychloroquine level, you will need to have it drawn 4 hours or more after your last dose.  If you wish to have your labs drawn at another location, please call the office 24 hours in advance to send orders.  If you have any questions regarding directions or hours of operation,  please call [phone].   As a reminder, please drink plenty of water prior to coming for your lab work. Thanks!  

## 2022-01-22 LAB — CBC WITH DIFFERENTIAL/PLATELET
Absolute Monocytes: 589 cells/uL (ref 200–950)
Basophils Absolute: 70 cells/uL (ref 0–200)
Basophils Relative: 1.1 %
Eosinophils Absolute: 390 cells/uL (ref 15–500)
Eosinophils Relative: 6.1 %
HCT: 49.9 % — ABNORMAL HIGH (ref 35.0–45.0)
Hemoglobin: 16.6 g/dL — ABNORMAL HIGH (ref 11.7–15.5)
Lymphs Abs: 1997 cells/uL (ref 850–3900)
MCH: 29.1 pg (ref 27.0–33.0)
MCHC: 33.3 g/dL (ref 32.0–36.0)
MCV: 87.5 fL (ref 80.0–100.0)
MPV: 11.3 fL (ref 7.5–12.5)
Monocytes Relative: 9.2 %
Neutro Abs: 3354 cells/uL (ref 1500–7800)
Neutrophils Relative %: 52.4 %
Platelets: 212 10*3/uL (ref 140–400)
RBC: 5.7 10*6/uL — ABNORMAL HIGH (ref 3.80–5.10)
RDW: 12.2 % (ref 11.0–15.0)
Total Lymphocyte: 31.2 %
WBC: 6.4 10*3/uL (ref 3.8–10.8)

## 2022-01-22 LAB — COMPLETE METABOLIC PANEL WITH GFR
AG Ratio: 2 (calc) (ref 1.0–2.5)
ALT: 17 U/L (ref 6–29)
AST: 18 U/L (ref 10–35)
Albumin: 4.2 g/dL (ref 3.6–5.1)
Alkaline phosphatase (APISO): 60 U/L (ref 37–153)
BUN: 14 mg/dL (ref 7–25)
CO2: 29 mmol/L (ref 20–32)
Calcium: 9.6 mg/dL (ref 8.6–10.4)
Chloride: 103 mmol/L (ref 98–110)
Creat: 0.77 mg/dL (ref 0.50–1.03)
Globulin: 2.1 g/dL (calc) (ref 1.9–3.7)
Glucose, Bld: 86 mg/dL (ref 65–99)
Potassium: 5.1 mmol/L (ref 3.5–5.3)
Sodium: 140 mmol/L (ref 135–146)
Total Bilirubin: 0.7 mg/dL (ref 0.2–1.2)
Total Protein: 6.3 g/dL (ref 6.1–8.1)
eGFR: 93 mL/min/{1.73_m2} (ref >=60)

## 2022-01-22 NOTE — Progress Notes (Signed)
CMP WNL.  RBC count, hgb, and hct remain slightly elevated-stable. Rest of CBC WNL.

## 2022-03-02 DIAGNOSIS — H6241 Otitis externa in other diseases classified elsewhere, right ear: Secondary | ICD-10-CM | POA: Diagnosis not present

## 2022-03-02 DIAGNOSIS — L4050 Arthropathic psoriasis, unspecified: Secondary | ICD-10-CM | POA: Diagnosis not present

## 2022-03-02 DIAGNOSIS — B369 Superficial mycosis, unspecified: Secondary | ICD-10-CM | POA: Diagnosis not present

## 2022-03-09 DIAGNOSIS — H938X1 Other specified disorders of right ear: Secondary | ICD-10-CM | POA: Diagnosis not present

## 2022-03-09 DIAGNOSIS — H60331 Swimmer's ear, right ear: Secondary | ICD-10-CM | POA: Diagnosis not present

## 2022-03-31 DIAGNOSIS — H60331 Swimmer's ear, right ear: Secondary | ICD-10-CM | POA: Diagnosis not present

## 2022-04-08 DIAGNOSIS — H60331 Swimmer's ear, right ear: Secondary | ICD-10-CM | POA: Diagnosis not present

## 2022-04-15 NOTE — Progress Notes (Signed)
Office Visit Note  Patient: Kaitlyn Hardy             Date of Birth: 1971/03/19           MRN: [id number]             PCP: Madison Hickman, FNP Referring: Madison Hickman, FNP Visit Date: 04/29/2022 Occupation: '@GUAROCC'$ @  Subjective:  Pain in joints  History of Present Illness: Kaitlyn Hardy is a 51 y.o. female with history of psoriatic arthritis, psoriasis and osteoarthritis.  She she states that in January 2023 she developed a rash on her face which led to cellulitis and eventually resolved.  He did not stop halts during that infection.  She states that her last tetanus shot of Kaitlyn Hardy was in July 2023.  She developed a urine infection in August and is skipped Taltz that month.  She states that she was seen by an ENT doctor who told her that she had a fungal and bacterial ear infection and it took a long time for her to recover from it.  She states that she did well for a while but now she has been having some discomfort in her hands and her SI joints.  She states that she has not had SI joint pain in last few years since she started Cosentyx prior to [employer].  She has not had recurrence of the rash.  Activities of Daily Living:  Patient reports morning stiffness for 5 minutes.   Patient Reports nocturnal pain.  Difficulty dressing/grooming: Denies Difficulty climbing stairs: Denies Difficulty getting out of chair: Denies Difficulty using hands for taps, buttons, cutlery, and/or writing: Denies  Review of Systems  Constitutional:  Positive for fatigue.  HENT:  Negative for mouth sores and mouth dryness.   Eyes:  Positive for dryness.  Respiratory:  Negative for shortness of breath.   Cardiovascular:  Negative for chest pain and palpitations.  Gastrointestinal:  Negative for blood in stool, constipation and diarrhea.  Endocrine: Negative for increased urination.  Genitourinary:  Negative for involuntary urination.  Musculoskeletal:  Positive for joint pain, joint pain, joint  swelling, myalgias, muscle weakness, morning stiffness, muscle tenderness and myalgias. Negative for gait problem.  Skin:  Negative for color change, rash, hair loss and sensitivity to sunlight.  Allergic/Immunologic: Positive for susceptible to infections.  Neurological:  Negative for dizziness and headaches.  Hematological:  Negative for swollen glands.  Psychiatric/Behavioral:  Negative for depressed mood and sleep disturbance. The patient is not nervous/anxious.     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   Fibroid 06/26/2013    Past Medical History:  Diagnosis Date   Allergy    Arthritis    psoriasis    Factor V Leiden ([place])    recent  dx - single, no problems   OAB (overactive bladder)    SVD (spontaneous vaginal delivery)    x 2   Vitamin D deficiency     Family History  Problem Relation Age of Onset   Pancreatic cancer Mother    Diabetes Mother    Thyroid disease Mother    Fibromyalgia Mother    Prostate cancer Father    Healthy Son    Colon cancer Neg Hx    Rectal cancer Neg Hx    Stomach cancer Neg Hx  Past Surgical History:  Procedure Laterality Date   ABLATION     done in MD's office    AUGMENTATION MAMMAPLASTY Bilateral 02/25/1999   saline   BREAST SURGERY  02/1999   augmentation    LAPAROSCOPIC ASSISTED VAGINAL HYSTERECTOMY N/A 06/26/2013   Procedure: LAPAROSCOPIC ASSISTED VAGINAL HYSTERECTOMY;  Surgeon: Marylynn Pearson, MD;  Location: [facility];  Service: Gynecology;  Laterality: N/A;   WISDOM TOOTH EXTRACTION     Social History   Social History Narrative   Not on file    There is no immunization history on file for this patient.   Objective: Vital Signs: BP 120/81 (BP Location: Left Arm, Patient Position: Sitting, Cuff Size: Normal)   Pulse 73   Resp 15   Ht '5\' 5"'$  (1.651 m)    Wt 156 lb 3.2 oz (70.9 kg)   LMP 05/07/2013 (LMP Unknown)   BMI 25.99 kg/m    Physical Exam Vitals and nursing note reviewed.  Constitutional:      Appearance: She is well-developed.  HENT:     Head: Normocephalic and atraumatic.  Eyes:     Conjunctiva/sclera: Conjunctivae normal.  Cardiovascular:     Rate and Rhythm: Normal rate and regular rhythm.     Heart sounds: Normal heart sounds.  Pulmonary:     Effort: Pulmonary effort is normal.     Breath sounds: Normal breath sounds.  Abdominal:     General: Bowel sounds are normal.     Palpations: Abdomen is soft.  Musculoskeletal:     Cervical back: Normal range of motion.  Lymphadenopathy:     Cervical: No cervical adenopathy.  Skin:    General: Skin is warm and dry.     Capillary Refill: Capillary refill takes less than 2 seconds.     Comments: Mild erythema was noted over bilateral lower extremities in the shin area.  Neurological:     Mental Status: She is alert and oriented to person, place, and time.  Psychiatric:        Behavior: Behavior normal.      Musculoskeletal Exam: Cervical thoracic and lumbar spine were in good range of motion.  She had no SI joint tenderness.  Shoulder joints, elbow joints, wrist joints, MCPs PIPs and DIPs been good range of motion except the right elbow joint had mild contracture.  She had tenderness on palpation of bilateral first MCP joints.  No synovitis was noted.  Bilateral PIP and DIP thickening was noted.  Hip joints and knee joints with good range of motion.  She had no tenderness over ankles or MTPs.  There was no Planter fasciitis or Achilles tendinitis.  CDAI Exam: CDAI Score: -- Patient Global: --; Provider Global: -- Swollen: --; Tender: -- Joint Exam 04/29/2022   No joint exam has been documented for this visit   There is currently no information documented on the homunculus. Go to the Rheumatology activity and complete the homunculus joint exam.  Investigation: No  additional findings.  Imaging: No results found.  Recent Labs: Lab Results  Component Value Date   WBC 6.4 01/21/2022   HGB 16.6 (H) 01/21/2022   PLT 212 01/21/2022   NA 140 01/21/2022   K 5.1 01/21/2022   CL 103 01/21/2022   CO2 29 01/21/2022   GLUCOSE 86 01/21/2022   BUN 14 01/21/2022   CREATININE 0.77 01/21/2022   BILITOT 0.7 01/21/2022   ALKPHOS 47 08/28/2019   AST 18 01/21/2022   ALT 17 01/21/2022   PROT 6.3 01/21/2022  ALBUMIN 4.4 08/28/2019   CALCIUM 9.6 01/21/2022   GFRAA >60 08/28/2019   QFTBGOLDPLUS NEGATIVE 09/30/2021    Speciality Comments: MTX-fatigue,Cosentyx-diarrhea, Humira-inadequate response,  Procedures:  No procedures performed Allergies: Patient has no known allergies.   Assessment / Plan:     Visit Diagnoses: Psoriatic arthritis ([place]) - Dx at [place] [facility]-09/2019: Patient had been doing well on Taltz injections.  She discontinued Toltz in July after she developed ear infection.  She had cellulitis on her face in January 2023 and then a difficult to treat ear infection in August ,2023.  She is hesitant to go on a stronger immunosuppressive agent.  She had aggressive psoriatic arthritis in the past.  She discontinued methotrexate due to fatigue and had an inadequate response to Humira.  She did well on Cosentyx but her rash was not controlled and she developed diarrhea and Cosyntex.  She has been on [place] for a while which has worked well for her.  We had a detailed discussion regarding treatment options.  She states she is hesitant to go back on Taltz as she is concerned about recurrent infections.  She is also made some dietary modifications which she believes are helping.  After discussing different treatment options and their side effects she wants to proceed with Rutherford Nail.  Handout was given and side effects were discussed.  She has no history of depression.  I plan to start her on Otezla once approved.  We will apply for [facility].  She will be given Otezla  samples today.  Counseled patient that Rutherford Nail is a PDE 4 inhibitor that works to treat psoriasis and the joint pain and tenderness of psoriatic arthritis.  Counseled patient on purpose, proper use, and adverse effects of Otezla.  Reviewed the most common adverse effects of weight loss, depression, nausea/diarrhea/vomiting, headaches, and nasal congestion.  Advised patient to notify office of any serious changes in mood and/or thoughts of suicide.  Provided patient with medication education material and answered all questions.  Patient consented to [place].    Patient dose will be Otezla starter titration pack and then 30 mg twice daily.  Prescription pending insurance approval and once approved patient may pick up sample for starter pack from our office.   Psoriasis-she had no active lesions of psoriasis today.  She had erythema on bilateral lower extremities which is unchanged.  High risk medication use -(Taltz 80 mg sq q 28 days-discontinue July 2023 initiated in March 2022.)  Previous therapy includes humira-inadequate response, MTX-fatigue, and cosentyx-diarrhea. - Plan: CBC with Differential/Platelet, COMPLETE METABOLIC PANEL WITH GFR today.  Information regarding immunization was placed in the AVS.  Primary osteoarthritis of both hands-she had bilateral PIP and DIP thickening.  No synovitis was noted.  She had tenderness over bilateral first MCP joints.  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  The right elbow joint contractures unchanged.  Primary osteoarthritis of both feet-she denies any discomfort in her feet today.  Pes cavus of both feet-using arch support was advised.  Vitamin D deficiency-vitamin D was normal on September 30, 2021.  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   Orders: Orders Placed This Encounter  Procedures   CBC with Differential/Platelet   COMPLETE METABOLIC PANEL WITH GFR   No orders of  the defined types were placed in this encounter.    Follow-Up Instructions: Return in about 2 months (around 06/29/2022) for Psoriatic arthritis.   Bo Merino, MD  Note - This record has been created using [employer].  Chart creation errors have been sought, but may not always  have been located. Such creation errors do not reflect on  the standard of medical care.

## 2022-04-29 ENCOUNTER — Other Ambulatory Visit (HOSPITAL_COMMUNITY): Payer: Self-pay

## 2022-04-29 ENCOUNTER — Ambulatory Visit: Payer: 59 | Attending: Rheumatology | Admitting: Rheumatology

## 2022-04-29 ENCOUNTER — Encounter: Payer: Self-pay | Admitting: Rheumatology

## 2022-04-29 ENCOUNTER — Telehealth: Payer: Self-pay | Admitting: Pharmacist

## 2022-04-29 VITALS — BP 120/81 | HR 73 | Resp 15 | Ht 65.0 in | Wt 156.2 lb

## 2022-04-29 DIAGNOSIS — L4050 Arthropathic psoriasis, unspecified: Secondary | ICD-10-CM

## 2022-04-29 DIAGNOSIS — L409 Psoriasis, unspecified: Secondary | ICD-10-CM

## 2022-04-29 DIAGNOSIS — M19041 Primary osteoarthritis, right hand: Secondary | ICD-10-CM

## 2022-04-29 DIAGNOSIS — Q6672 Congenital pes cavus, left foot: Secondary | ICD-10-CM

## 2022-04-29 DIAGNOSIS — D6851 Activated protein C resistance: Secondary | ICD-10-CM

## 2022-04-29 DIAGNOSIS — M24521 Contracture, right elbow: Secondary | ICD-10-CM

## 2022-04-29 DIAGNOSIS — M19042 Primary osteoarthritis, left hand: Secondary | ICD-10-CM

## 2022-04-29 DIAGNOSIS — R5383 Other fatigue: Secondary | ICD-10-CM

## 2022-04-29 DIAGNOSIS — M19072 Primary osteoarthritis, left ankle and foot: Secondary | ICD-10-CM

## 2022-04-29 DIAGNOSIS — E559 Vitamin D deficiency, unspecified: Secondary | ICD-10-CM

## 2022-04-29 DIAGNOSIS — Z79899 Other long term (current) drug therapy: Secondary | ICD-10-CM

## 2022-04-29 DIAGNOSIS — J302 Other seasonal allergic rhinitis: Secondary | ICD-10-CM

## 2022-04-29 DIAGNOSIS — M19071 Primary osteoarthritis, right ankle and foot: Secondary | ICD-10-CM

## 2022-04-29 DIAGNOSIS — Q6671 Congenital pes cavus, right foot: Secondary | ICD-10-CM

## 2022-04-29 NOTE — Patient Instructions (Signed)
Vaccines You are taking a medication(s) that can suppress your immune system.  The following immunizations are recommended: Flu annually Covid-19  Td/Tdap (tetanus, diphtheria, pertussis) every 10 years Pneumonia (Prevnar 15 then Pneumovax 23 at least 1 year apart.  Alternatively, can take Prevnar 20 without needing additional dose) Shingrix: 2 doses from 4 weeks to 6 months apart  Please check with your PCP to make sure you are up to date.    Apremilast Tablets What is this medication? APREMILAST (a PRE mil ast) treats arthritis and psoriasis. It may also be used to treat mouth ulcers in people with a condition that causes blood vessel swelling (Behcet syndrome). It works by decreasing inflammation. This medicine may be used for other purposes; ask your health care provider or pharmacist if you have questions. COMMON BRAND NAME(S): Rutherford Nail What should I tell my care team before I take this medication? They need to know if you have any of these conditions: Dehydration Kidney disease Mental illness An unusual or allergic reaction to apremilast, other medications, foods, dyes, or preservatives Pregnant or trying to get pregnant Breast-feeding How should I use this medication? Take this medication by mouth with a glass of water. Follow the directions on the prescription label. Do not cut, crush or chew this medication. You can take it with or without food. If it upsets your stomach, take it with food. Take your medication at regular intervals. Do not take it more often than directed. Do not stop taking except on your care team's advice. Talk to your care team about the use of this medication in children. Special care may be needed. Overdosage: If you think you have taken too much of this medicine contact a poison control center or emergency room at once. NOTE: This medicine is only for you. Do not share this medicine with others. What if I miss a dose? If you miss a dose, take it as soon as  you can. If it is almost time for your next dose, take only that dose. Do not take double or extra doses. What may interact with this medication? Certain medications for seizures like carbamazepine, phenobarbital, phenytoin Rifampin This list may not describe all possible interactions. Give your health care provider a list of all the medicines, herbs, non-prescription drugs, or dietary supplements you use. Also tell them if you smoke, drink alcohol, or use illegal drugs. Some items may interact with your medicine. What should I watch for while using this medication? Tell your care team if your symptoms do not start to get better or if they get worse. Patients and their families should watch out for new or worsening depression or thoughts of suicide. Also watch out for sudden changes in feelings such as feeling anxious, agitated, panicky, irritable, hostile, aggressive, impulsive, severely restless, overly excited and hyperactive, or not being able to sleep. If this happens, call your care team. Check with your care team if you get an attack of severe diarrhea, nausea and vomiting, or if you sweat a lot. The loss of too much body fluid can make it dangerous for you to take this medication. What side effects may I notice from receiving this medication? Side effects that you should report to your care team as soon as possible: Allergic reactions--skin rash, itching, hives, swelling of the face, lips, tongue, or throat Worsening mood, feelings of depression Side effects that usually do not require medical attention (report to your care team if they continue or are bothersome): Diarrhea Headache Loss of  appetite with weight loss Nausea Vomiting This list may not describe all possible side effects. Call your doctor for medical advice about side effects. You may report side effects to FDA at 1-800-FDA-1088. Where should I keep my medication? Keep out of the reach of children. Store below 30 degrees C  (86 degrees F). Throw away any unused medication after the expiration date. NOTE: This sheet is a summary. It may not cover all possible information. If you have questions about this medicine, talk to your doctor, pharmacist, or health care provider.  2023 Elsevier/Gold Standard (2012-10-17 00:00:00)

## 2022-04-29 NOTE — Telephone Encounter (Signed)
Please start [place].  Dose: '30mg'$  twice daily  Dx: Psoriatic arthritis (L40.5) and Psoriasis (L40.9)  Previously tried therapies: Taltz - recurrent infections Cosentyx - diarrhea Humira - inadequate response MTX - fatigue  Patient received starter pack in clinic.  Once approved through insurance, pt will need to be enrolled into copay card program  Knox Saliva, PharmD, MPH, BCPS, CPP Clinical Pharmacist (Rheumatology and Pulmonology)

## 2022-04-29 NOTE — Telephone Encounter (Signed)
Patient Advocate Encounter  Received notification that prior authorization for OTEZLA is required.   PA submitted on 10.4.23 Key [id number] Status is pending

## 2022-04-29 NOTE — Progress Notes (Signed)
Pharmacy Note  Subjective:  Patient presents today to [facility] for follow up office visit.   Patient was seen by the pharmacist for counseling on [place] for psoriatic arthritis and plaque psoriasis.  Objective: CMP     Component Value Date/Time   NA 140 01/21/2022 0858   K 5.1 01/21/2022 0858   CL 103 01/21/2022 0858   CO2 29 01/21/2022 0858   GLUCOSE 86 01/21/2022 0858   BUN 14 01/21/2022 0858   CREATININE 0.77 01/21/2022 0858   CALCIUM 9.6 01/21/2022 0858   PROT 6.3 01/21/2022 0858   ALBUMIN 4.4 08/28/2019 0846   AST 18 01/21/2022 0858   AST 16 08/28/2019 0846   ALT 17 01/21/2022 0858   ALT 14 08/28/2019 0846   ALKPHOS 47 08/28/2019 0846   BILITOT 0.7 01/21/2022 0858   BILITOT 0.5 08/28/2019 0846   GFRNONAA >60 08/28/2019 0846   GFRAA >60 08/28/2019 0846    CBC    Component Value Date/Time   WBC 6.4 01/21/2022 0858   RBC 5.70 (H) 01/21/2022 0858   HGB 16.6 (H) 01/21/2022 0858   HGB 15.3 (H) 08/28/2019 0846   HCT 49.9 (H) 01/21/2022 0858   PLT 212 01/21/2022 0858   PLT 269 08/28/2019 0846   MCV 87.5 01/21/2022 0858   MCH 29.1 01/21/2022 0858   MCHC 33.3 01/21/2022 0858   RDW 12.2 01/21/2022 0858   LYMPHSABS 1,997 01/21/2022 0858   MONOABS 0.8 08/28/2019 0846   EOSABS 390 01/21/2022 0858   BASOSABS 70 01/21/2022 0858     Assessment/Plan:  Counseled patient that Rutherford Nail is a PDE 4 inhibitor that works to treat psoriasis and the joint pain and tenderness of psoriatic arthritis.  Counseled patient on purpose, proper use, and adverse effects of Otezla.  Reviewed the most common adverse effects of weight loss, depression, nausea/diarrhea/vomiting, headaches, and nasal congestion.  Advised patient to notify office of any serious changes in mood and/or thoughts of suicide.  Provided patient with medication education material and answered all questions.  Patient consented to [place].  Will apply for Otezla through patient's insurance and update when we receive  a response.  Patient dose will be Otezla starter titration pack and then 30 mg twice daily.  Prescription pending insurance approval and once approved patient may pick up sample for starter pack from our office.  Patient verbalized understanding.   Medication Samples have been provided to the patient.  Drug name: Otezla starter pack [place]: [id number] Qty: 27 tabs LOT: [id number] Exp.Date: 07/27/2023  Dosing instructions: Take as instructed on starter pack  The patient has been instructed regarding the correct time, dose, and frequency of taking this medication, including desired effects and most common side effects.   Pierre Dellarocco S Collen Vincent 12:05 PM 04/29/2022

## 2022-04-30 LAB — COMPLETE METABOLIC PANEL WITH GFR
AG Ratio: 2 (calc) (ref 1.0–2.5)
ALT: 19 U/L (ref 6–29)
AST: 20 U/L (ref 10–35)
Albumin: 4.5 g/dL (ref 3.6–5.1)
Alkaline phosphatase (APISO): 55 U/L (ref 37–153)
BUN: 15 mg/dL (ref 7–25)
CO2: 27 mmol/L (ref 20–32)
Calcium: 9 mg/dL (ref 8.6–10.4)
Chloride: 103 mmol/L (ref 98–110)
Creat: 0.68 mg/dL (ref 0.50–1.03)
Globulin: 2.3 g/dL (calc) (ref 1.9–3.7)
Glucose, Bld: 77 mg/dL (ref 65–99)
Potassium: 4.6 mmol/L (ref 3.5–5.3)
Sodium: 141 mmol/L (ref 135–146)
Total Bilirubin: 1 mg/dL (ref 0.2–1.2)
Total Protein: 6.8 g/dL (ref 6.1–8.1)
eGFR: 105 mL/min/{1.73_m2} (ref >=60)

## 2022-04-30 LAB — CBC WITH DIFFERENTIAL/PLATELET
Absolute Monocytes: 530 cells/uL (ref 200–950)
Basophils Absolute: 78 cells/uL (ref 0–200)
Basophils Relative: 1 %
Eosinophils Absolute: 250 cells/uL (ref 15–500)
Eosinophils Relative: 3.2 %
HCT: 48.6 % — ABNORMAL HIGH (ref 35.0–45.0)
Hemoglobin: 16.6 g/dL — ABNORMAL HIGH (ref 11.7–15.5)
Lymphs Abs: 1700 cells/uL (ref 850–3900)
MCH: 30.2 pg (ref 27.0–33.0)
MCHC: 34.2 g/dL (ref 32.0–36.0)
MCV: 88.4 fL (ref 80.0–100.0)
MPV: 11.1 fL (ref 7.5–12.5)
Monocytes Relative: 6.8 %
Neutro Abs: 5242 cells/uL (ref 1500–7800)
Neutrophils Relative %: 67.2 %
Platelets: 180 10*3/uL (ref 140–400)
RBC: 5.5 10*6/uL — ABNORMAL HIGH (ref 3.80–5.10)
RDW: 12 % (ref 11.0–15.0)
Total Lymphocyte: 21.8 %
WBC: 7.8 10*3/uL (ref 3.8–10.8)

## 2022-04-30 NOTE — Progress Notes (Signed)
Hemoglobin is elevated and stable.  CMP is normal.

## 2022-05-02 ENCOUNTER — Other Ambulatory Visit (HOSPITAL_COMMUNITY): Payer: Self-pay

## 2022-05-05 ENCOUNTER — Other Ambulatory Visit (HOSPITAL_COMMUNITY): Payer: Self-pay

## 2022-05-05 MED ORDER — APREMILAST 30 MG PO TABS: 30.0000 mg | ORAL_TABLET | Freq: Two times a day (BID) | ORAL | 3 refills | Status: DC

## 2022-05-05 NOTE — Telephone Encounter (Signed)
Received notification from [facility] regarding a prior authorization for Kaitlyn Hardy. Authorization has been APPROVED from  05/01/2022 to 05/02/2023  Unable to run test claim because patient must fill [facility]: [phone]  Authorization # [id number]  Attempted to enroll patient into copay card but response online states patient is not eligible for copay card (?)  Patient must contact [employer] support for assistance [phone]).  ATC patient to discuss. Left VM requesting return call. MyChart message sent with details  Knox Saliva, PharmD, MPH, BCPS, CPP Clinical Pharmacist (Rheumatology and Pulmonology)

## 2022-05-11 ENCOUNTER — Telehealth: Payer: Self-pay | Admitting: Rheumatology

## 2022-05-11 NOTE — Telephone Encounter (Signed)
[facility] and was advised that I needed to speak with a pharmacist to verify patient's information. I then spoke with pharmacist and the pharmacist was requesting a prescription for an Otezla starter pack. I advised the pharmacist that patient was provided with a starter pack sample in the office on 04/29/2022. I was then advised that patient could call the pharmacy to schedule delivery.   I called patient and advised her of the conversation and patient called to schedule delivery. Patient was advised at that point that there was not any documentation of the conversation. I then called [facility] again and that conversation was documented so I was advised to have the patient call back to schedule delivery and provide the group number of the co pay card. I called patient and advised.

## 2022-05-11 NOTE — Telephone Encounter (Signed)
Patient called the office stating she was contacted by [place] regarding her [facility] prescription. They told her their was some issues they were contacting our office for and wouldn't elaborate for the patient. Patient requests some assistance with this matter.

## 2022-05-13 NOTE — Telephone Encounter (Signed)
She states that the [place] rx is scheduled to be received at home today. Closing encounter.  Knox Saliva, PharmD, MPH, BCPS, CPP Clinical Pharmacist (Rheumatology and Pulmonology)

## 2022-06-04 ENCOUNTER — Encounter: Payer: Self-pay | Admitting: Rheumatology

## 2022-06-04 NOTE — Telephone Encounter (Signed)
Patient had recent labs which were normal.  If she does not have any infection she may restart Taltz.

## 2022-06-10 ENCOUNTER — Telehealth: Payer: Self-pay | Admitting: Rheumatology

## 2022-06-10 NOTE — Telephone Encounter (Signed)
Patient states she restarted Taltz on June 06, 2022. Patient states she did initiate the [place] and was on it for less than 2 months.

## 2022-06-10 NOTE — Telephone Encounter (Signed)
Patient called stating at her October appointment she scheduled a 2 month follow-up appointment due to changing medications.  Patient states since that time she has restarted Kaitlyn Hardy and is checking if she needs to keep that appointment or if she can schedule an appointment in February.

## 2022-06-10 NOTE — Telephone Encounter (Signed)
Ok to postpone the appt unless she is not doing well and would like to keep the scheduled appt

## 2022-06-10 NOTE — Telephone Encounter (Signed)
Please clarify when she restarted taltz and if she ever initiated [place]?  Recommend follow up 6-8 weeks after initiating therapy.

## 2022-06-22 ENCOUNTER — Telehealth: Payer: Self-pay | Admitting: *Deleted

## 2022-06-22 DIAGNOSIS — L4050 Arthropathic psoriasis, unspecified: Secondary | ICD-10-CM

## 2022-06-22 DIAGNOSIS — Z79899 Other long term (current) drug therapy: Secondary | ICD-10-CM

## 2022-06-22 DIAGNOSIS — L409 Psoriasis, unspecified: Secondary | ICD-10-CM

## 2022-06-22 NOTE — Telephone Encounter (Signed)
Patient contacted the office stating she was recently restarted on [place]. Patient states she reached out to the pharmacy to have the medication filled and they told her she no longer qualifies for the assistance program. Patient requests some guidance.

## 2022-06-25 ENCOUNTER — Other Ambulatory Visit (HOSPITAL_COMMUNITY): Payer: Self-pay

## 2022-06-25 MED ORDER — TALTZ 80 MG/ML ~~LOC~~ SOAJ: 80.0000 mg | SUBCUTANEOUS | 0 refills | Status: DC

## 2022-06-25 NOTE — Telephone Encounter (Signed)
Submitted a Prior Authorization request to [facility] for TALTZ via CoverMyMeds. Will update once we receive a response.  Key: [id number]  Per automated response on CMM, Your PA has been resolved, no additional PA is required. For further inquiries please contact the number on the back of the member prescription card. (Message [phone])  [place] card: Doreen Salvage: [id number] PCN: [id number] GRP: [id number] ID: [id number]   [facility]ed [facility]. Per rep, they were using different copay card. Rep successfully reprocessed claim through correct Taltz copay card and copay is now $5. Reached back out to patient advising of this and that she should go ahea and reach back out to [facility] to schedule shipment to home.   Removed Rutherford Nail from med list today and added Taltz back into list as no-print for now.  Knox Saliva, PharmD, MPH, BCPS, CPP Clinical Pharmacist (Rheumatology and Pulmonology)

## 2022-06-25 NOTE — Addendum Note (Signed)
Addended by: Cassandria Anger on: 06/25/2022 11:34 AM   Modules accepted: Orders

## 2022-07-01 ENCOUNTER — Ambulatory Visit: Payer: No Typology Code available for payment source | Admitting: Physician Assistant

## 2022-07-07 ENCOUNTER — Other Ambulatory Visit: Payer: Self-pay | Admitting: Physician Assistant

## 2022-07-07 DIAGNOSIS — L409 Psoriasis, unspecified: Secondary | ICD-10-CM

## 2022-07-07 DIAGNOSIS — L4050 Arthropathic psoriasis, unspecified: Secondary | ICD-10-CM

## 2022-07-07 DIAGNOSIS — Z79899 Other long term (current) drug therapy: Secondary | ICD-10-CM

## 2022-07-07 NOTE — Telephone Encounter (Signed)
Next Visit: 09/02/2022  Last Visit: 04/29/2022  Last Fill: 06/25/2022 (30 day supply)  TG:PQDIYMEBR arthritis   Current Dose per office note 04/29/2022: Taltz 80 mg sq q 28 days   Labs: 04/29/2022 Hemoglobin is elevated and stable.  CMP is normal.   TB Gold: 09/30/2021 Neg    Okay to refill Taltz?

## 2022-08-21 NOTE — Progress Notes (Unsigned)
Office Visit Note  Patient: Kaitlyn Hardy             Date of Birth: May 02, 1971           MRN: [id number]             PCP: Madison Hickman, FNP Referring: Madison Hickman, FNP Visit Date: 09/02/2022 Occupation: '@GUAROCC'$ @  Subjective:  Medication monitoring-otezla   History of Present Illness: Kaitlyn Hardy is a 52 y.o. female with history of psoriatic arthritis and osteoarthritis.    CBC and CMP updated on 08/26/22.    Taltz 06/06/22.     Activities of Daily Living:  Patient reports morning stiffness for *** {minute/hour:19697}.   Patient {ACTIONS;DENIES/REPORTS:21021675::"Denies"} nocturnal pain.  Difficulty dressing/grooming: {ACTIONS;DENIES/REPORTS:21021675::"Denies"} Difficulty climbing stairs: {ACTIONS;DENIES/REPORTS:21021675::"Denies"} Difficulty getting out of chair: {ACTIONS;DENIES/REPORTS:21021675::"Denies"} Difficulty using hands for taps, buttons, cutlery, and/or writing: {ACTIONS;DENIES/REPORTS:21021675::"Denies"}  No Rheumatology ROS completed.   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   Fibroid 06/26/2013    Past Medical History:  Diagnosis Date   Allergy    Arthritis    psoriasis    Factor V Leiden ([place])    recent  dx - single, no problems   OAB (overactive bladder)    SVD (spontaneous vaginal delivery)    x 2   Vitamin D deficiency     Family History  Problem Relation Age of Onset   Pancreatic cancer Mother    Diabetes Mother    Thyroid disease Mother    Fibromyalgia Mother    Prostate cancer Father    Healthy Son    Colon cancer Neg Hx    Rectal cancer Neg Hx    Stomach cancer Neg Hx    Past Surgical History:  Procedure Laterality Date   ABLATION     done in MD's office    AUGMENTATION MAMMAPLASTY Bilateral 02/25/1999   saline   BREAST SURGERY  02/1999    augmentation    LAPAROSCOPIC ASSISTED VAGINAL HYSTERECTOMY N/A 06/26/2013   Procedure: LAPAROSCOPIC ASSISTED VAGINAL HYSTERECTOMY;  Surgeon: Marylynn Pearson, MD;  Location: [facility];  Service: Gynecology;  Laterality: N/A;   WISDOM TOOTH EXTRACTION     Social History   Social History Narrative   Not on file    There is no immunization history on file for this patient.   Objective: Vital Signs: LMP 05/07/2013 (LMP Unknown)    Physical Exam Vitals and nursing note reviewed.  Constitutional:      Appearance: She is well-developed.  HENT:     Head: Normocephalic and atraumatic.  Eyes:     Conjunctiva/sclera: Conjunctivae normal.  Cardiovascular:     Rate and Rhythm: Normal rate and regular rhythm.     Heart sounds: Normal heart sounds.  Pulmonary:     Effort: Pulmonary effort is normal.     Breath sounds: Normal breath sounds.  Abdominal:     General: Bowel sounds are normal.     Palpations: Abdomen is soft.  Musculoskeletal:     Cervical back: Normal range of motion.  Skin:    General: Skin is warm and dry.     Capillary Refill: Capillary refill takes less than 2 seconds.  Neurological:     Mental Status: She is alert and oriented to person, place, and time.  Psychiatric:  Behavior: Behavior normal.      Musculoskeletal Exam: ***  CDAI Exam: CDAI Score: -- Patient Global: --; Provider Global: -- Swollen: --; Tender: -- Joint Exam 09/02/2022   No joint exam has been documented for this visit   There is currently no information documented on the homunculus. Go to the Rheumatology activity and complete the homunculus joint exam.  Investigation: No additional findings.  Imaging: No results found.  Recent Labs: Lab Results  Component Value Date   WBC 7.8 04/29/2022   HGB 16.6 (H) 04/29/2022   PLT 180 04/29/2022   NA 141 04/29/2022   K 4.6 04/29/2022   CL 103 04/29/2022   CO2 27 04/29/2022   GLUCOSE 77 04/29/2022   BUN 15 04/29/2022   CREATININE  0.68 04/29/2022   BILITOT 1.0 04/29/2022   ALKPHOS 47 08/28/2019   AST 20 04/29/2022   ALT 19 04/29/2022   PROT 6.8 04/29/2022   ALBUMIN 4.4 08/28/2019   CALCIUM 9.0 04/29/2022   GFRAA >60 08/28/2019   QFTBGOLDPLUS NEGATIVE 09/30/2021    Speciality Comments: MTX-fatigue,Cosentyx-diarrhea, Humira-inadequate response,  Procedures:  No procedures performed Allergies: Patient has no known allergies.   Assessment / Plan:     Visit Diagnoses: No diagnosis found.  Orders: No orders of the defined types were placed in this encounter.  No orders of the defined types were placed in this encounter.   Face-to-face time spent with patient was *** minutes. Greater than 50% of time was spent in counseling and coordination of care.  Follow-Up Instructions: No follow-ups on file.   Earnestine Mealing, CMA  Note - This record has been created using Editor, commissioning.  Chart creation errors have been sought, but may not always  have been located. Such creation errors do not reflect on  the standard of medical care.

## 2022-08-25 NOTE — Progress Notes (Unsigned)
Subjective:  Patient ID: Kaitlyn Hardy, female    DOB: 1970/08/17  Age: 52 y.o. MRN: [id number]  Chief Complaints:  Patient is establishing as a new patient.  History of Present illness:  Current Outpatient Medications on File Prior to Visit  Medication Sig Dispense Refill   calcipotriene (DOVONOX) 0.005 % ointment Apply topically 2 (two) times daily. 60 g 0   cholecalciferol (VITAMIN D) 1000 UNITS tablet Take 2,000 Units by mouth every other day.     Clobetasol Propionate 0.025 % CREA Apply topically daily.     estradiol (CLIMARA - DOSED IN MG/24 HR) 0.025 mg/24hr patch Apply 1 patch every week by transdermal route.     Fexofenadine-Pseudoephedrine (ALLEGRA-D PO) Take by mouth daily.     ibuprofen (ADVIL,MOTRIN) 200 MG tablet Take 800 mg by mouth every 6 (six) hours as needed for moderate pain.     MAGNESIUM PO Take by mouth at bedtime.     meclizine (ANTIVERT) 25 MG tablet Take 25 mg by mouth every 8 (eight) hours as needed. (Patient not taking: Reported on 01/21/2022)     Multiple Vitamin (MULTIVITAMIN) capsule Take 1 capsule by mouth daily.     MYRBETRIQ 25 MG TB24 tablet Take 25 mg by mouth daily. (Patient not taking: Reported on 01/21/2022)     OVER THE COUNTER MEDICATION daily. Metabalance (Patient not taking: Reported on 01/21/2022)     Probiotic Product (PROBIOTIC PO) Take by mouth daily.     solifenacin (VESICARE) 10 MG tablet Take 10 mg by mouth daily.     TALTZ 80 MG/ML SOAJ INJECT ONE PEN SUBCUTANEOUSLY EVERY 4 WEEKS. REFRIGERATE. ALLOW PEN TO REACH ROOM TEMP PRIOR TO INJECTION. 1 mL 2   No current facility-administered medications on file prior to visit.  . Social History   Socioeconomic History   Marital status: Married    Spouse name: Not on file   Number of children: Not on file   Years of education: Not on file   Highest education level: Associate degree: academic program  Occupational History   Not on file  Tobacco Use   Smoking status: Never    Passive  exposure: Never   Smokeless tobacco: Never  Vaping Use   Vaping Use: Never used  Substance and Sexual Activity   Alcohol use: No   Drug use: No   Sexual activity: Yes    Birth control/protection: Surgical    Comment: Hysterectomy  Other Topics Concern   Not on file  Social History Narrative   Not on file   Social Determinants of Health   Financial Resource Strain: Low Risk  (08/19/2022)   Overall Financial Resource Strain (CARDIA)    Difficulty of Paying Living Expenses: Not hard at all  Food Insecurity: No Food Insecurity (08/19/2022)   Hunger Vital Sign    Worried About Running Out of Food in the Last Year: Never true    Ran Out of Food in the Last Year: Never true  Transportation Needs: No Transportation Needs (08/19/2022)   PRAPARE - Hydrologist (Medical): No    Lack of Transportation (Non-Medical): No  Physical Activity: Sufficiently Active (08/19/2022)   Exercise Vital Sign    Days of Exercise per Week: 4 days    Minutes of Exercise per Session: 50 min  Stress: No Stress Concern Present (08/19/2022)   [place]    Feeling of Stress : Only a little  Social Connections:  Moderately Integrated (08/19/2022)   Social Connection and Isolation Panel [NHANES]    Frequency of Communication with Friends and Family: Once a week    Frequency of Social Gatherings with Friends and Family: Once a week    Attends Religious Services: More than 4 times per year    Active Member of Clubs or Organizations: Yes    Attends Music therapist: More than 4 times per year    Marital Status: Married   Past Medical History:  Diagnosis Date   Allergy    Arthritis    psoriasis    Factor V Leiden ([place])    recent  dx - single, no problems   OAB (overactive bladder)    SVD (spontaneous vaginal delivery)    x 2   Vitamin D deficiency    Family History  Problem Relation Age of Onset    Pancreatic cancer Mother    Diabetes Mother    Thyroid disease Mother    Fibromyalgia Mother    Prostate cancer Father    Healthy Son    Colon cancer Neg Hx    Rectal cancer Neg Hx    Stomach cancer Neg Hx     Review of Systems   Objective:  LMP 05/07/2013 (LMP Unknown)      04/29/2022   11:06 AM 01/21/2022    8:40 AM 01/21/2022    8:32 AM  BP/Weight  Systolic BP 697 948 016  Diastolic BP 81 81 83  Wt. (Lbs) 156.2  161  BMI 25.99 kg/m2  27.64 kg/m2    Physical Exam Diabetic Foot Exam - Simple   No data filed      Lab Results  Component Value Date   WBC 7.8 04/29/2022   HGB 16.6 (H) 04/29/2022   HCT 48.6 (H) 04/29/2022   PLT 180 04/29/2022   GLUCOSE 77 04/29/2022   ALT 19 04/29/2022   AST 20 04/29/2022   NA 141 04/29/2022   K 4.6 04/29/2022   CL 103 04/29/2022   CREATININE 0.68 04/29/2022   BUN 15 04/29/2022   CO2 27 04/29/2022      Assessment & Plan:   There are no diagnoses linked to this encounter.     Follow-up: No follow-ups on file.  AVS was given to patient prior to departure.  Neil Crouch, DNP, [place] [phone]

## 2022-08-26 ENCOUNTER — Ambulatory Visit: Payer: 59 | Admitting: Nurse Practitioner

## 2022-08-26 ENCOUNTER — Encounter: Payer: Self-pay | Admitting: Nurse Practitioner

## 2022-08-26 VITALS — BP 108/70 | HR 73 | Temp 98.4°F | Resp 14 | Ht 65.0 in | Wt 168.0 lb

## 2022-08-26 DIAGNOSIS — R32 Unspecified urinary incontinence: Secondary | ICD-10-CM | POA: Insufficient documentation

## 2022-08-26 DIAGNOSIS — L4050 Arthropathic psoriasis, unspecified: Secondary | ICD-10-CM | POA: Diagnosis not present

## 2022-08-26 DIAGNOSIS — H6123 Impacted cerumen, bilateral: Secondary | ICD-10-CM

## 2022-08-26 DIAGNOSIS — N39498 Other specified urinary incontinence: Secondary | ICD-10-CM

## 2022-08-26 DIAGNOSIS — Z0000 Encounter for general adult medical examination without abnormal findings: Secondary | ICD-10-CM

## 2022-08-26 DIAGNOSIS — N951 Menopausal and female climacteric states: Secondary | ICD-10-CM

## 2022-08-26 DIAGNOSIS — M1380 Other specified arthritis, unspecified site: Secondary | ICD-10-CM | POA: Insufficient documentation

## 2022-08-26 DIAGNOSIS — M1990 Unspecified osteoarthritis, unspecified site: Secondary | ICD-10-CM | POA: Insufficient documentation

## 2022-08-26 HISTORY — DX: Encounter for general adult medical examination without abnormal findings: Z00.00

## 2022-08-26 MED ORDER — SOLIFENACIN SUCCINATE 10 MG PO TABS: 10.0000 mg | ORAL_TABLET | Freq: Every day | ORAL | 1 refills | Status: DC

## 2022-08-26 MED ORDER — VEOZAH 45 MG PO TABS: 1.0000 | ORAL_TABLET | Freq: Once | ORAL | 0 refills | Status: DC

## 2022-08-26 MED ORDER — VEOZAH 45 MG PO TABS: 1.0000 | ORAL_TABLET | Freq: Every day | ORAL | 0 refills | Status: DC

## 2022-08-26 NOTE — Patient Instructions (Signed)
Please use OTC debrox for ear wax  Preventive Care 83-53 Years Old, Female Preventive care refers to lifestyle choices and visits with your health care provider that can promote health and wellness. Preventive care visits are also called wellness exams. What can I expect for my preventive care visit? Counseling Your health care provider may ask you questions about your: Medical history, including: Past medical problems. Family medical history. Pregnancy history. Current health, including: Menstrual cycle. Method of birth control. Emotional well-being. Home life and relationship well-being. Sexual activity and sexual health. Lifestyle, including: Alcohol, nicotine or tobacco, and drug use. Access to firearms. Diet, exercise, and sleep habits. Work and work Statistician. Sunscreen use. Safety issues such as seatbelt and bike helmet use. Physical exam Your health care provider will check your: Height and weight. These may be used to calculate your BMI (body mass index). BMI is a measurement that tells if you are at a healthy weight. Waist circumference. This measures the distance around your waistline. This measurement also tells if you are at a healthy weight and may help predict your risk of certain diseases, such as type 2 diabetes and high blood pressure. Heart rate and blood pressure. Body temperature. Skin for abnormal spots. What immunizations do I need?  Vaccines are usually given at various ages, according to a schedule. Your health care provider will recommend vaccines for you based on your age, medical history, and lifestyle or other factors, such as travel or where you work. What tests do I need? Screening Your health care provider may recommend screening tests for certain conditions. This may include: Lipid and cholesterol levels. Diabetes screening. This is done by checking your blood sugar (glucose) after you have not eaten for a while (fasting). Pelvic exam and Pap  test. Hepatitis B test. Hepatitis C test. HIV (human immunodeficiency virus) test. STI (sexually transmitted infection) testing, if you are at risk. Lung cancer screening. Colorectal cancer screening. Mammogram. Talk with your health care provider about when you should start having regular mammograms. This may depend on whether you have a family history of breast cancer. BRCA-related cancer screening. This may be done if you have a family history of breast, ovarian, tubal, or peritoneal cancers. Bone density scan. This is done to screen for osteoporosis. Talk with your health care provider about your test results, treatment options, and if necessary, the need for more tests. Follow these instructions at home: Eating and drinking  Eat a diet that includes fresh fruits and vegetables, whole grains, lean protein, and low-fat dairy products. Take vitamin and mineral supplements as recommended by your health care provider. Do not drink alcohol if: Your health care provider tells you not to drink. You are pregnant, may be pregnant, or are planning to become pregnant. If you drink alcohol: Limit how much you have to 0-1 drink a day. Know how much alcohol is in your drink. In the [place] one drink equals one 12 oz bottle of beer (355 mL), one 5 oz glass of wine (148 mL), or one 1 oz glass of hard liquor (44 mL). Lifestyle Brush your teeth every morning and night with fluoride toothpaste. Floss one time each day. Exercise for at least 30 minutes 5 or more days each week. Do not use any products that contain nicotine or tobacco. These products include cigarettes, chewing tobacco, and vaping devices, such as e-cigarettes. If you need help quitting, ask your health care provider. Do not use drugs. If you are sexually active, practice safe sex. Use  condom or other form of protection to prevent STIs. If you do not wish to become pregnant, use a form of birth control. If you plan to become pregnant,  see your health care provider for a prepregnancy visit. Take aspirin only as told by your health care provider. Make sure that you understand how much to take and what form to take. Work with your health care provider to find out whether it is safe and beneficial for you to take aspirin daily. Find healthy ways to manage stress, such as: Meditation, yoga, or listening to music. Journaling. Talking to a trusted person. Spending time with friends and family. Minimize exposure to UV radiation to reduce your risk of skin cancer. Safety Always wear your seat belt while driving or riding in a vehicle. Do not drive: If you have been drinking alcohol. Do not ride with someone who has been drinking. When you are tired or distracted. While texting. If you have been using any mind-altering substances or drugs. Wear a helmet and other protective equipment during sports activities. If you have firearms in your house, make sure you follow all gun safety procedures. Seek help if you have been physically or sexually abused. What's next? Visit your health care provider once a year for an annual wellness visit. Ask your health care provider how often you should have your eyes and teeth checked. Stay up to date on all vaccines. This information is not intended to replace advice given to you by your health care provider. Make sure you discuss any questions you have with your health care provider. Document Revised: 01/08/2021 Document Reviewed: 01/08/2021 Elsevier Patient Education  2023 Elsevier Inc.  

## 2022-08-26 NOTE — Assessment & Plan Note (Signed)
Taking estradiol patch weekly Provided sample of Veozah to see if it is works better She will call to office regarding its effect

## 2022-08-26 NOTE — Assessment & Plan Note (Addendum)
Use OTC debrox as needed Ear lavage done in the clinic for bilateral ear

## 2022-08-26 NOTE — Assessment & Plan Note (Signed)
Encourage to keep up eating healthy diet and regular exercise Office will call for abnormal labs

## 2022-08-26 NOTE — Assessment & Plan Note (Signed)
TALTZ 80 MG/ML SOAJ  Continue follow up with rheumatologist

## 2022-08-26 NOTE — Assessment & Plan Note (Signed)
Well controlled with Vesicare

## 2022-08-27 ENCOUNTER — Encounter: Payer: Self-pay | Admitting: Nurse Practitioner

## 2022-08-27 LAB — COMPREHENSIVE METABOLIC PANEL
ALT: 20 IU/L (ref 0–32)
AST: 19 IU/L (ref 0–40)
Albumin/Globulin Ratio: 2 (ref 1.2–2.2)
Albumin: 4.2 g/dL (ref 3.8–4.9)
Alkaline Phosphatase: 57 IU/L (ref 44–121)
BUN/Creatinine Ratio: 22 (ref 9–23)
BUN: 21 mg/dL (ref 6–24)
Bilirubin Total: 0.4 mg/dL (ref 0.0–1.2)
CO2: 24 mmol/L (ref 20–29)
Calcium: 9.5 mg/dL (ref 8.7–10.2)
Chloride: 103 mmol/L (ref 96–106)
Creatinine, Ser: 0.96 mg/dL (ref 0.57–1.00)
Globulin, Total: 2.1 g/dL (ref 1.5–4.5)
Glucose: 84 mg/dL (ref 70–99)
Potassium: 4.4 mmol/L (ref 3.5–5.2)
Sodium: 140 mmol/L (ref 134–144)
Total Protein: 6.3 g/dL (ref 6.0–8.5)
eGFR: 71 mL/min/{1.73_m2} (ref >59)

## 2022-08-27 LAB — HEMOGLOBIN A1C
Est. average glucose Bld gHb Est-mCnc: 114 mg/dL
Hgb A1c MFr Bld: 5.6 % (ref 4.8–5.6)

## 2022-08-27 LAB — CBC WITH DIFFERENTIAL/PLATELET
Basophils Absolute: 0.1 10*3/uL (ref 0.0–0.2)
Basos: 1 %
EOS (ABSOLUTE): 0.2 10*3/uL (ref 0.0–0.4)
Eos: 2 %
Hematocrit: 47.1 % — ABNORMAL HIGH (ref 34.0–46.6)
Hemoglobin: 16.2 g/dL — ABNORMAL HIGH (ref 11.1–15.9)
Immature Grans (Abs): 0 10*3/uL (ref 0.0–0.1)
Immature Granulocytes: 0 %
Lymphocytes Absolute: 2.2 10*3/uL (ref 0.7–3.1)
Lymphs: 23 %
MCH: 29.8 pg (ref 26.6–33.0)
MCHC: 34.4 g/dL (ref 31.5–35.7)
MCV: 87 fL (ref 79–97)
Monocytes Absolute: 0.6 10*3/uL (ref 0.1–0.9)
Monocytes: 6 %
Neutrophils Absolute: 6.7 10*3/uL (ref 1.4–7.0)
Neutrophils: 68 %
Platelets: 224 10*3/uL (ref 150–450)
RBC: 5.43 x10E6/uL — ABNORMAL HIGH (ref 3.77–5.28)
RDW: 11.8 % (ref 11.7–15.4)
WBC: 9.8 10*3/uL (ref 3.4–10.8)

## 2022-08-27 LAB — TSH: TSH: 1.22 u[IU]/mL (ref 0.450–4.500)

## 2022-09-02 ENCOUNTER — Encounter: Payer: Self-pay | Admitting: Physician Assistant

## 2022-09-02 ENCOUNTER — Ambulatory Visit: Payer: 59 | Attending: Physician Assistant | Admitting: Physician Assistant

## 2022-09-02 VITALS — BP 122/79 | HR 60 | Resp 12 | Ht 65.0 in | Wt 159.0 lb

## 2022-09-02 DIAGNOSIS — E559 Vitamin D deficiency, unspecified: Secondary | ICD-10-CM

## 2022-09-02 DIAGNOSIS — L409 Psoriasis, unspecified: Secondary | ICD-10-CM

## 2022-09-02 DIAGNOSIS — R5383 Other fatigue: Secondary | ICD-10-CM

## 2022-09-02 DIAGNOSIS — M19071 Primary osteoarthritis, right ankle and foot: Secondary | ICD-10-CM

## 2022-09-02 DIAGNOSIS — R21 Rash and other nonspecific skin eruption: Secondary | ICD-10-CM

## 2022-09-02 DIAGNOSIS — J302 Other seasonal allergic rhinitis: Secondary | ICD-10-CM

## 2022-09-02 DIAGNOSIS — Z79899 Other long term (current) drug therapy: Secondary | ICD-10-CM | POA: Diagnosis not present

## 2022-09-02 DIAGNOSIS — M24521 Contracture, right elbow: Secondary | ICD-10-CM

## 2022-09-02 DIAGNOSIS — L4050 Arthropathic psoriasis, unspecified: Secondary | ICD-10-CM

## 2022-09-02 DIAGNOSIS — Q6672 Congenital pes cavus, left foot: Secondary | ICD-10-CM

## 2022-09-02 DIAGNOSIS — D6851 Activated protein C resistance: Secondary | ICD-10-CM

## 2022-09-02 DIAGNOSIS — M19072 Primary osteoarthritis, left ankle and foot: Secondary | ICD-10-CM

## 2022-09-02 DIAGNOSIS — M19041 Primary osteoarthritis, right hand: Secondary | ICD-10-CM

## 2022-09-02 DIAGNOSIS — Q6671 Congenital pes cavus, right foot: Secondary | ICD-10-CM

## 2022-09-02 DIAGNOSIS — M19042 Primary osteoarthritis, left hand: Secondary | ICD-10-CM

## 2022-09-02 DIAGNOSIS — Z111 Encounter for screening for respiratory tuberculosis: Secondary | ICD-10-CM

## 2022-09-02 NOTE — Patient Instructions (Addendum)
Randel Pigg, MD- dermatologist  Phone: [phone] Address: [address], [place], [place] [address]   Standing Labs We placed an order today for your standing lab work.   Please have your standing labs drawn in April and every 3 months   Please have your labs drawn 2 weeks prior to your appointment so that the provider can discuss your lab results at your appointment.  Please note that you may see your imaging and lab results in [place] before we have reviewed them. We will contact you once all results are reviewed. Please allow our office up to 72 hours to thoroughly review all of the results before contacting the office for clarification of your results.  Lab hours are:   Monday through Thursday from 8:00 am -12:30 pm and 1:00 pm-5:00 pm and Friday from 8:00 am-12:00 pm.  Please be advised, all patients with office appointments requiring lab work will take precedent over walk-in lab work.   Labs are drawn by [facility]. Please bring your co-pay at the time of your lab draw.  You may receive a bill from [facility] for your lab work.  Please note if you are on Hydroxychloroquine and and an order has been placed for a Hydroxychloroquine level, you will need to have it drawn 4 hours or more after your last dose.  If you wish to have your labs drawn at another location, please call the office 24 hours in advance so we can fax the orders.  The office is located at [address] [place], [place], [place] [address] No appointment is necessary.    If you have any questions regarding directions or hours of operation,  please call [phone].   As a reminder, please drink plenty of water prior to coming for your lab work. Thanks!  Vaccines You are taking a medication(s) that can suppress your immune system.  The following immunizations are recommended: Flu annually Covid-19  Td/Tdap (tetanus, diphtheria, pertussis) every 10 years Pneumonia (Prevnar 15 then Pneumovax 23 at  least 1 year apart.  Alternatively, can take Prevnar 20 without needing additional dose) Shingrix: 2 doses from 4 weeks to 6 months apart  Please check with your PCP to make sure you are up to date.  If you have signs or symptoms of an infection or start antibiotics: First, call your PCP for workup of your infection. Hold your medication through the infection, until you complete your antibiotics, and until symptoms resolve if you take the following: Injectable medication (Actemra, Benlysta, Cimzia, Cosentyx, Enbrel, Humira, Kevzara, Orencia, Remicade, Simponi, Stelara, Taltz, Tremfya) Methotrexate Leflunomide (Arava) Mycophenolate (Cellcept) Morrie Sheldon, Olumiant, or Rinvoq

## 2022-09-28 ENCOUNTER — Other Ambulatory Visit: Payer: Self-pay | Admitting: Rheumatology

## 2022-09-28 DIAGNOSIS — L409 Psoriasis, unspecified: Secondary | ICD-10-CM

## 2022-09-28 DIAGNOSIS — Z79899 Other long term (current) drug therapy: Secondary | ICD-10-CM

## 2022-09-28 DIAGNOSIS — L4050 Arthropathic psoriasis, unspecified: Secondary | ICD-10-CM

## 2022-09-28 NOTE — Telephone Encounter (Signed)
Next Visit: 02/24/2023  Last Visit: 09/02/2022  Last Fill: 07/07/2022  [id number] arthritis   Current Dose per office note 09/02/2022: Taltz 80 mg sq injections every 28 days.    Labs: 08/26/2022 RBC 5.43, Hgb 16.2, Hct 47.1   TB Gold: 09/30/2021 Neg    Okay to refill Taltz?

## 2022-10-06 ENCOUNTER — Telehealth: Payer: Self-pay | Admitting: Pharmacist

## 2022-10-06 NOTE — Telephone Encounter (Signed)
Pharmacy calling from [phone], [phone] fax, regarding PA for [place]. Please mark if patient can received preferred product or if you want to complete PA. Ref [id number]. Thank you.

## 2022-10-06 NOTE — Telephone Encounter (Signed)
Submitted a Prior Authorization RENEWAL request to [facility] for Kaitlyn Hardy via fax. Will update once we receive a response.  Fax: [phone] Phone: [phone] Case # [id number]  Knox Saliva, PharmD, MPH, BCPS, CPP Clinical Pharmacist (Rheumatology and Pulmonology)

## 2022-10-07 NOTE — Telephone Encounter (Signed)
Received notification from [facility] regarding a prior authorization for [place]. Authorization has been APPROVED from 10/06/22 to 10/06/23. Approval letter sent to scan center.  Patient must continue to fill through [facility]: [phone]  Authorization # [id number]  Knox Saliva, PharmD, MPH, BCPS, CPP Clinical Pharmacist (Rheumatology and Pulmonology)

## 2022-10-07 NOTE — Telephone Encounter (Signed)
This has been completed yesterday afternoon.  Knox Saliva, PharmD, MPH, BCPS, CPP Clinical Pharmacist (Rheumatology and Pulmonology)

## 2022-10-23 ENCOUNTER — Encounter: Payer: Self-pay | Admitting: Physician Assistant

## 2022-10-23 ENCOUNTER — Ambulatory Visit (INDEPENDENT_AMBULATORY_CARE_PROVIDER_SITE_OTHER): Payer: 59 | Admitting: Physician Assistant

## 2022-10-23 VITALS — BP 112/70 | HR 78 | Temp 97.4°F | Ht 65.0 in | Wt 158.8 lb

## 2022-10-23 DIAGNOSIS — M436 Torticollis: Secondary | ICD-10-CM | POA: Diagnosis not present

## 2022-10-23 MED ORDER — IBUPROFEN 800 MG PO TABS: 800.0000 mg | ORAL_TABLET | Freq: Three times a day (TID) | ORAL | 1 refills | Status: AC | PRN

## 2022-10-23 MED ORDER — PREDNISONE 20 MG PO TABS: ORAL_TABLET | ORAL | 0 refills | Status: AC

## 2022-10-23 MED ORDER — CYCLOBENZAPRINE HCL 5 MG PO TABS: 5.0000 mg | ORAL_TABLET | Freq: Three times a day (TID) | ORAL | 1 refills | Status: DC | PRN

## 2022-10-23 NOTE — Progress Notes (Signed)
Acute Office Visit  Subjective:    Patient ID: Kaitlyn Hardy, female    DOB: 06/16/1971, 52 y.o.   MRN: [id number]  Chief Complaint  Patient presents with   Neck Pain    HPI: Patient is in today for complaints of neck pain for the past 6 weeks.  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  Feels tightness with moving and bending neck.  Feels very stiff.   Denies weakness or numbness in upper extremities  Past Medical History:  Diagnosis Date   Allergy    Arthritis    psoriasis    Encounter for annual physical exam 08/26/2022   Factor V Leiden ([place])    recent  dx - single, no problems   OAB (overactive bladder)    SVD (spontaneous vaginal delivery)    x 2   Vitamin D deficiency     Past Surgical History:  Procedure Laterality Date   ABDOMINAL HYSTERECTOMY  partial   2014   ABLATION     done in MD's office    AUGMENTATION MAMMAPLASTY Bilateral 02/25/1999   saline   BREAST SURGERY  02/1999   augmentation    LAPAROSCOPIC ASSISTED VAGINAL HYSTERECTOMY N/A 06/26/2013   Procedure: LAPAROSCOPIC ASSISTED VAGINAL HYSTERECTOMY;  Surgeon: Marylynn Pearson, MD;  Location: [facility];  Service: Gynecology;  Laterality: N/A;   WISDOM TOOTH EXTRACTION      Family History  Problem Relation Age of Onset   Pancreatic cancer Mother    Diabetes Mother    Thyroid disease Mother    Fibromyalgia Mother    Cancer Mother    Prostate cancer Father    Cancer Father    Healthy Son    Colon cancer Neg Hx    Rectal cancer Neg Hx    Stomach cancer Neg Hx     Social History   Socioeconomic History   Marital status: Married    Spouse name: Not on file   Number of children: Not on file   Years of education: Not on file   Highest education level: Associate degree: academic program  Occupational History   Not on file  Tobacco Use    Smoking status: Never    Passive exposure: Never   Smokeless tobacco: Never  Vaping Use   Vaping Use: Never used  Substance and Sexual Activity   Alcohol use: No   Drug use: No   Sexual activity: Yes    Birth control/protection: Surgical    Comment: Husband vasectomy  Other Topics Concern   Not on file  Social History Narrative   Not on file   Social Determinants of Health   Financial Resource Strain: Low Risk  (08/19/2022)   Overall Financial Resource Strain (CARDIA)    Difficulty of Paying Living Expenses: Not hard at all  Food Insecurity: No Food Insecurity (08/19/2022)   Hunger Vital Sign    Worried About Running Out of Food in the Last Year: Never true    Ran Out of Food in the Last Year: Never true  Transportation Needs: No Transportation Needs (08/19/2022)   PRAPARE - Hydrologist (Medical): No    Lack of Transportation (Non-Medical): No  Physical Activity: Sufficiently Active (08/19/2022)   Exercise Vital Sign    Days of Exercise per Week: 4 days    Minutes of  Exercise per Session: 50 min  Stress: No Stress Concern Present (08/19/2022)   [place]    Feeling of Stress : Only a little  Social Connections: Moderately Integrated (08/19/2022)   Social Connection and Isolation Panel [NHANES]    Frequency of Communication with Friends and Family: Once a week    Frequency of Social Gatherings with Friends and Family: Once a week    Attends Religious Services: More than 4 times per year    Active Member of [employer] or Organizations: Yes    Attends Music therapist: More than 4 times per year    Marital Status: Married  Human resources officer Violence: Not on file    Outpatient Medications Prior to Visit  Medication Sig Dispense Refill   calcipotriene (DOVONOX) 0.005 % ointment Apply topically 2 (two) times daily. 60 g 0   cholecalciferol (VITAMIN D) 1000 UNITS tablet Take  2,000 Units by mouth every other day.     Clobetasol Propionate 0.025 % CREA Apply topically daily.     estradiol (CLIMARA - DOSED IN MG/24 HR) 0.05 mg/24hr patch Place 0.05 mg onto the skin once a week.     Fexofenadine-Pseudoephedrine (ALLEGRA-D PO) Take by mouth daily.     MAGNESIUM PO Take by mouth at bedtime.     Multiple Vitamin (MULTIVITAMIN) capsule Take 1 capsule by mouth daily.     Probiotic Product (PROBIOTIC PO) Take by mouth daily.     solifenacin (VESICARE) 10 MG tablet Take 1 tablet (10 mg total) by mouth daily. 90 tablet 1   TALTZ 80 MG/ML SOAJ INJECT ONE PEN SUBCUTANEOUSLY EVERY 4 WEEKS. REFRIGERATE. ALLOW PEN TO REACH ROOM TEMP PRIOR TO INJECTION. 1 mL 2   ibuprofen (ADVIL,MOTRIN) 200 MG tablet Take 800 mg by mouth every 6 (six) hours as needed for moderate pain.     Fezolinetant (VEOZAH) 45 MG TABS Take 1 tablet (45 mg total) by mouth daily. (Patient not taking: Reported on 09/02/2022) 1 tablet 0   No facility-administered medications prior to visit.    No Known Allergies  Review of Systems    CONSTITUTIONAL: Negative for chills, fatigue, fever, CARDIOVASCULAR: Negative for chest pain,  RESPIRATORY: Negative for recent cough and dyspnea.   MSK: see HPI INTEGUMENTARY: Negative for rash.       Objective:   PHYSICAL EXAM:   VS: BP 112/70 (BP Location: Left Arm, Patient Position: Sitting, Cuff Size: Normal)   Pulse 78   Temp (!) 97.4 F (36.3 C) (Temporal)   Ht 5\' 5"  (1.651 m)   Wt 158 lb 12.8 oz (72 kg)   LMP 05/07/2013 (LMP Unknown)   SpO2 98%   BMI 26.43 kg/m   GEN: Well nourished, well developed, in no acute distress  Neck: no JVD or masses - no thyromegaly- left side of neck with moderate muscle spasm and tenderness noted Cardiac: RRR; no murmurs,  Respiratory:  normal respiratory rate and pattern with no distress - normal breath sounds with no rales, rhonchi, wheezes or rubs   Health Maintenance Due  Topic Date Due   COVID-19 Vaccine (1) Never done    HIV Screening  Never done   DTaP/Tdap/Td (1 - Tdap) Never done   Zoster Vaccines- Shingrix (1 of 2) Never done   PAP SMEAR-Modifier  Never done   COLONOSCOPY (Pts 45-65yrs Insurance coverage will need to be confirmed)  Never done   MAMMOGRAM  10/06/2019   INFLUENZA VACCINE  Never done  There are no preventive care reminders to display for this patient.   Lab Results  Component Value Date   TSH 1.220 08/26/2022   Lab Results  Component Value Date   WBC 9.8 08/26/2022   HGB 16.2 (H) 08/26/2022   HCT 47.1 (H) 08/26/2022   MCV 87 08/26/2022   PLT 224 08/26/2022   Lab Results  Component Value Date   NA 140 08/26/2022   K 4.4 08/26/2022   CO2 24 08/26/2022   GLUCOSE 84 08/26/2022   BUN 21 08/26/2022   CREATININE 0.96 08/26/2022   BILITOT 0.4 08/26/2022   ALKPHOS 57 08/26/2022   AST 19 08/26/2022   ALT 20 08/26/2022   PROT 6.3 08/26/2022   ALBUMIN 4.2 08/26/2022   CALCIUM 9.5 08/26/2022   ANIONGAP 6 08/28/2019   EGFR 71 08/26/2022   No results found for: "CHOL" No results found for: "HDL" No results found for: "LDLCALC" No results found for: "TRIG" No results found for: "CHOLHDL" Lab Results  Component Value Date   HGBA1C 5.6 08/26/2022       Assessment & Plan:  Torticollis -     predniSONE; Take 3 tablets (60 mg total) by mouth daily with breakfast for 3 days, THEN 2 tablets (40 mg total) daily with breakfast for 3 days, THEN 1 tablet (20 mg total) daily with breakfast for 3 days.  Dispense: 18 tablet; Refill: 0 -     Cyclobenzaprine HCl; Take 1 tablet (5 mg total) by mouth 3 (three) times daily as needed for muscle spasms.  Dispense: 30 tablet; Refill: 1 -     Ibuprofen; Take 1 tablet (800 mg total) by mouth every 8 (eight) hours as needed.  Dispense: 90 tablet; Refill: 1     Meds ordered this encounter  Medications   predniSONE (DELTASONE) 20 MG tablet    Sig: Take 3 tablets (60 mg total) by mouth daily with breakfast for 3 days, THEN 2 tablets (40 mg  total) daily with breakfast for 3 days, THEN 1 tablet (20 mg total) daily with breakfast for 3 days.    Dispense:  18 tablet    Refill:  0    Order Specific Question:   Supervising Provider    Answer:   Shelton Silvas   cyclobenzaprine (FLEXERIL) 5 MG tablet    Sig: Take 1 tablet (5 mg total) by mouth 3 (three) times daily as needed for muscle spasms.    Dispense:  30 tablet    Refill:  1    Order Specific Question:   Supervising Provider    Answer:   Shelton Silvas   ibuprofen (ADVIL) 800 MG tablet    Sig: Take 1 tablet (800 mg total) by mouth every 8 (eight) hours as needed.    Dispense:  90 tablet    Refill:  1    Order Specific Question:   Supervising Provider    Answer:   Shelton Silvas    No orders of the defined types were placed in this encounter.    Follow-up: Return if symptoms worsen or fail to improve. Rom exercises given An After Visit Summary was printed and given to the patient.  Yetta Flock [facility] [phone]

## 2022-11-16 ENCOUNTER — Telehealth: Payer: Self-pay

## 2022-11-16 NOTE — Telephone Encounter (Signed)
Pt called today to request a same day appointment for the following symptoms:Thrush. Unfortunately our schedule is full and we have no openings for today or tomorrow. Pt was notified that she should be able to go a e-visit with a [place] provider through her MyChart.

## 2022-11-18 ENCOUNTER — Other Ambulatory Visit: Payer: 59

## 2022-11-18 ENCOUNTER — Other Ambulatory Visit: Payer: Self-pay

## 2022-11-18 DIAGNOSIS — Z79899 Other long term (current) drug therapy: Secondary | ICD-10-CM

## 2022-11-18 DIAGNOSIS — Z111 Encounter for screening for respiratory tuberculosis: Secondary | ICD-10-CM

## 2022-11-22 LAB — CBC WITH DIFF/PLATELET
Basophils Absolute: 0.1 10*3/uL (ref 0.0–0.2)
Basos: 1 %
EOS (ABSOLUTE): 0.2 10*3/uL (ref 0.0–0.4)
Eos: 5 %
Hematocrit: 47.4 % — ABNORMAL HIGH (ref 34.0–46.6)
Hemoglobin: 16 g/dL — ABNORMAL HIGH (ref 11.1–15.9)
Immature Grans (Abs): 0 10*3/uL (ref 0.0–0.1)
Immature Granulocytes: 0 %
Lymphocytes Absolute: 1.4 10*3/uL (ref 0.7–3.1)
Lymphs: 28 %
MCH: 30.1 pg (ref 26.6–33.0)
MCHC: 33.8 g/dL (ref 31.5–35.7)
MCV: 89 fL (ref 79–97)
Monocytes Absolute: 0.4 10*3/uL (ref 0.1–0.9)
Monocytes: 7 %
Neutrophils Absolute: 2.9 10*3/uL (ref 1.4–7.0)
Neutrophils: 59 %
Platelets: 190 10*3/uL (ref 150–450)
RBC: 5.32 x10E6/uL — ABNORMAL HIGH (ref 3.77–5.28)
RDW: 12.2 % (ref 11.7–15.4)
WBC: 4.9 10*3/uL (ref 3.4–10.8)

## 2022-11-22 LAB — QUANTIFERON-TB GOLD PLUS
QuantiFERON Mitogen Value: >10 IU/mL
QuantiFERON Nil Value: 0.01 IU/mL
QuantiFERON TB1 Ag Value: 0 IU/mL
QuantiFERON TB2 Ag Value: 0 IU/mL
QuantiFERON-TB Gold Plus: NEGATIVE

## 2022-11-22 LAB — COMPREHENSIVE METABOLIC PANEL
ALT: 18 IU/L (ref 0–32)
AST: 17 IU/L (ref 0–40)
Albumin/Globulin Ratio: 2.2 (ref 1.2–2.2)
Albumin: 4.2 g/dL (ref 3.8–4.9)
Alkaline Phosphatase: 62 IU/L (ref 44–121)
BUN/Creatinine Ratio: 21 (ref 9–23)
BUN: 14 mg/dL (ref 6–24)
Bilirubin Total: 0.5 mg/dL (ref 0.0–1.2)
CO2: 20 mmol/L (ref 20–29)
Calcium: 9 mg/dL (ref 8.7–10.2)
Chloride: 104 mmol/L (ref 96–106)
Creatinine, Ser: 0.66 mg/dL (ref 0.57–1.00)
Globulin, Total: 1.9 g/dL (ref 1.5–4.5)
Glucose: 94 mg/dL (ref 70–99)
Potassium: 4.9 mmol/L (ref 3.5–5.2)
Sodium: 142 mmol/L (ref 134–144)
Total Protein: 6.1 g/dL (ref 6.0–8.5)
eGFR: 105 mL/min/{1.73_m2} (ref >59)

## 2022-11-23 ENCOUNTER — Encounter: Payer: Self-pay | Admitting: Physician Assistant

## 2022-11-23 ENCOUNTER — Ambulatory Visit (INDEPENDENT_AMBULATORY_CARE_PROVIDER_SITE_OTHER): Payer: 59 | Admitting: Physician Assistant

## 2022-11-23 VITALS — BP 112/70 | HR 73 | Temp 97.5°F | Ht 65.0 in | Wt 158.2 lb

## 2022-11-23 DIAGNOSIS — K140 Glossitis: Secondary | ICD-10-CM | POA: Diagnosis not present

## 2022-11-23 DIAGNOSIS — L739 Follicular disorder, unspecified: Secondary | ICD-10-CM | POA: Diagnosis not present

## 2022-11-23 MED ORDER — DOXYCYCLINE HYCLATE 100 MG PO TABS
100.0000 mg | ORAL_TABLET | Freq: Two times a day (BID) | ORAL | 0 refills | Status: DC
Start: 2022-11-23 — End: 2023-06-14

## 2022-11-23 MED ORDER — DIPHENHYDRAMINE HCL 12.5 MG/5ML PO LIQD
5.0000 mL | Freq: Three times a day (TID) | ORAL | 0 refills | Status: DC
Start: 2022-11-23 — End: 2022-12-04

## 2022-11-23 NOTE — Progress Notes (Signed)
Acute Office Visit  Subjective:    Patient ID: Kaitlyn Hardy, female    DOB: [birth year], 52 y.o.   MRN: [id number]  Chief Complaint  Patient presents with   spot on tongue    HPI: Patient is in today for complaints of spot on left side of tongue that came up about 1 and 1/2 weeks ago.  Noted tongue also has felt swollen and sore.  It does burn when eating salty foods.  Did televisit with another provider and given rx for nystatin which did not help She has never smoked/vaped Pt is on Taltz injections for psoriasis - will hold for now  Pt with complaints of small lesions in vaginal area - state they do not bother her much - at times have small head to area -- GYN has seen and did not give certain diagnosis States they can come and go and at times can bleed or get larger Denies vaginal discharge  Past Medical History:  Diagnosis Date   Allergy    Arthritis    psoriasis    Encounter for annual physical exam 08/26/2022   Factor V Leiden (HCC)    recent  dx - single, no problems   OAB (overactive bladder)    SVD (spontaneous vaginal delivery)    x 2   Vitamin D deficiency     Past Surgical History:  Procedure Laterality Date   ABDOMINAL HYSTERECTOMY  partial   2014   ABLATION     done in MD's office    AUGMENTATION MAMMAPLASTY Bilateral 02/25/1999   saline   BREAST SURGERY  02/1999   augmentation    LAPAROSCOPIC ASSISTED VAGINAL HYSTERECTOMY N/A 06/26/2013   Procedure: LAPAROSCOPIC ASSISTED VAGINAL HYSTERECTOMY;  Surgeon: Zelphia Cairo, MD;  Location: [facility];  Service: Gynecology;  Laterality: N/A;   WISDOM TOOTH EXTRACTION      Family History  Problem Relation Age of Onset   Pancreatic cancer Mother    Diabetes Mother    Thyroid disease Mother    Fibromyalgia Mother    Cancer Mother    Prostate cancer Father    Cancer Father    Healthy Son    Colon cancer Neg Hx    Rectal cancer Neg Hx    Stomach cancer Neg Hx     Social History   Socioeconomic  History   Marital status: Married    Spouse name: Not on file   Number of children: Not on file   Years of education: Not on file   Highest education level: Associate degree: academic program  Occupational History   Not on file  Tobacco Use   Smoking status: Never    Passive exposure: Never   Smokeless tobacco: Never  Vaping Use   Vaping Use: Never used  Substance and Sexual Activity   Alcohol use: No   Drug use: No   Sexual activity: Yes    Birth control/protection: Surgical    Comment: Husband vasectomy  Other Topics Concern   Not on file  Social History Narrative   Not on file   Social Determinants of Health   Financial Resource Strain: Low Risk  (08/19/2022)   Overall Financial Resource Strain (CARDIA)    Difficulty of Paying Living Expenses: Not hard at all  Food Insecurity: No Food Insecurity (08/19/2022)   Hunger Vital Sign    Worried About Running Out of Food in the Last Year: Never true    Ran Out of Food in the Last Year: Never  true  Transportation Needs: No Transportation Needs (08/19/2022)   PRAPARE - Administrator, Civil Service (Medical): No    Lack of Transportation (Non-Medical): No  Physical Activity: Sufficiently Active (08/19/2022)   Exercise Vital Sign    Days of Exercise per Week: 4 days    Minutes of Exercise per Session: 50 min  Stress: No Stress Concern Present (08/19/2022)   [employer] of Occupational Health - Occupational Stress Questionnaire    Feeling of Stress : Only a little  Social Connections: Moderately Integrated (08/19/2022)   Social Connection and Isolation Panel [NHANES]    Frequency of Communication with Friends and Family: Once a week    Frequency of Social Gatherings with Friends and Family: Once a week    Attends Religious Services: More than 4 times per year    Active Member of [employer] or Organizations: Yes    Attends Engineer, structural: More than 4 times per year    Marital Status: Married  Careers information officer Violence: Not on file    Outpatient Medications Prior to Visit  Medication Sig Dispense Refill   calcipotriene (DOVONOX) 0.005 % ointment Apply topically 2 (two) times daily. 60 g 0   cholecalciferol (VITAMIN D) 1000 UNITS tablet Take 2,000 Units by mouth every other day.     Clobetasol Propionate 0.025 % CREA Apply topically daily.     cyclobenzaprine (FLEXERIL) 5 MG tablet Take 1 tablet (5 mg total) by mouth 3 (three) times daily as needed for muscle spasms. 30 tablet 1   estradiol (CLIMARA - DOSED IN MG/24 HR) 0.05 mg/24hr patch Place 0.05 mg onto the skin once a week.     Fexofenadine-Pseudoephedrine (ALLEGRA-D PO) Take by mouth daily.     ibuprofen (ADVIL) 800 MG tablet Take 1 tablet (800 mg total) by mouth every 8 (eight) hours as needed. 90 tablet 1   MAGNESIUM PO Take by mouth at bedtime.     Multiple Vitamin (MULTIVITAMIN) capsule Take 1 capsule by mouth daily.     Probiotic Product (PROBIOTIC PO) Take by mouth daily.     solifenacin (VESICARE) 10 MG tablet Take 1 tablet (10 mg total) by mouth daily. 90 tablet 1   TALTZ 80 MG/ML SOAJ INJECT ONE PEN SUBCUTANEOUSLY EVERY 4 WEEKS. REFRIGERATE. ALLOW PEN TO REACH ROOM TEMP PRIOR TO INJECTION. 1 mL 2   No facility-administered medications prior to visit.    No Known Allergies  Review of Systems CONSTITUTIONAL: Negative for chills, fatigue, fever,  E/N/T: Negative for ear pain, nasal congestion and sore throat. - has had tongue discomfort and lesion CARDIOVASCULAR: Negative for chest pain,  RESPIRATORY: Negative for recent cough and dyspnea.  GASTROINTESTINAL: Negative for abdominal pain, acid reflux symptoms, constipation, diarrhea, nausea and vomiting.  GU - see HPI         Objective:   PHYSICAL EXAM:   VS: BP 112/70 (BP Location: Left Arm, Patient Position: Sitting, Cuff Size: Large)   Pulse 73   Temp (!) 97.5 F (36.4 C) (Temporal)   Ht 5\' 5"  (1.651 m)   Wt 158 lb 3.2 oz (71.8 kg)   LMP 05/07/2013 (LMP  Unknown)   SpO2 97%   BMI 26.33 kg/m   GEN: Well nourished, well developed, in no acute distress  HEENT:  - Lips, Teeth and Gums - normal  Oropharynx - normal mucosa, palate, and posterior pharynx- tongue with white thick area on left side and mild redness of tongue Cardiac: RRR; no murmurs,  Respiratory:  normal respiratory rate and pattern with no distress - normal breath sounds with no rales, rhonchi, wheezes or rubs GU - small follicular lesions with whiteheads noted below vagina -   Health Maintenance Due  Topic Date Due   COVID-19 Vaccine (1) Never done   HIV Screening  Never done   DTaP/Tdap/Td (1 - Tdap) Never done   Zoster Vaccines- Shingrix (1 of 2) Never done   PAP SMEAR-Modifier  Never done   COLONOSCOPY (Pts 45-66yrs Insurance coverage will need to be confirmed)  Never done   MAMMOGRAM  10/06/2019    There are no preventive care reminders to display for this patient.   Lab Results  Component Value Date   TSH 1.220 08/26/2022   Lab Results  Component Value Date   WBC 4.9 11/18/2022   HGB 16.0 (H) 11/18/2022   HCT 47.4 (H) 11/18/2022   MCV 89 11/18/2022   PLT 190 11/18/2022   Lab Results  Component Value Date   NA 142 11/18/2022   K 4.9 11/18/2022   CO2 20 11/18/2022   GLUCOSE 94 11/18/2022   BUN 14 11/18/2022   CREATININE 0.66 11/18/2022   BILITOT 0.5 11/18/2022   ALKPHOS 62 11/18/2022   AST 17 11/18/2022   ALT 18 11/18/2022   PROT 6.1 11/18/2022   ALBUMIN 4.2 11/18/2022   CALCIUM 9.0 11/18/2022   ANIONGAP 6 08/28/2019   EGFR 105 11/18/2022   No results found for: "CHOL" No results found for: "HDL" No results found for: "LDLCALC" No results found for: "TRIG" No results found for: "CHOLHDL" Lab Results  Component Value Date   HGBA1C 5.6 08/26/2022       Assessment & Plan:  Glossitis/tongue lesion -     CBC with Differential/Platelet -     B12 and Folate Panel -     magic mouthwash (nystatin, hydrocortisone, diphenhydrAMINE) suspension;  Swish and spit 5 mLs 3 (three) times daily.  Dispense: 540 mL; Refill: 0 If lesion persists to notify and will refer to ENT  Folliculitis -     Doxycycline Hyclate; Take 1 tablet (100 mg total) by mouth 2 (two) times daily.  Dispense: 20 tablet; Refill: 0   Recommend antibacterial soap, dry off well after showers, white cotton underwear  Meds ordered this encounter  Medications   magic mouthwash (nystatin, hydrocortisone, diphenhydrAMINE) suspension    Sig: Swish and spit 5 mLs 3 (three) times daily.    Dispense:  540 mL    Refill:  0    Order Specific Question:   Supervising Provider    Answer:   Corey Harold   doxycycline (VIBRA-TABS) 100 MG tablet    Sig: Take 1 tablet (100 mg total) by mouth 2 (two) times daily.    Dispense:  20 tablet    Refill:  0    Order Specific Question:   Supervising Provider    AnswerCorey Harold    Orders Placed This Encounter  Procedures   CBC with Differential/Platelet   B12 and Folate Panel     Follow-up: Return if symptoms worsen or fail to improve.  An After Visit Summary was printed and given to the patient.  Jettie Pagan [facility] [phone]

## 2022-11-24 LAB — CBC WITH DIFFERENTIAL/PLATELET
Basophils Absolute: 0.1 10*3/uL (ref 0.0–0.2)
Basos: 1 %
EOS (ABSOLUTE): 0.3 10*3/uL (ref 0.0–0.4)
Eos: 5 %
Hematocrit: 49.3 % — ABNORMAL HIGH (ref 34.0–46.6)
Hemoglobin: 16.6 g/dL — ABNORMAL HIGH (ref 11.1–15.9)
Immature Grans (Abs): 0 10*3/uL (ref 0.0–0.1)
Immature Granulocytes: 0 %
Lymphocytes Absolute: 1.7 10*3/uL (ref 0.7–3.1)
Lymphs: 31 %
MCH: 29.6 pg (ref 26.6–33.0)
MCHC: 33.7 g/dL (ref 31.5–35.7)
MCV: 88 fL (ref 79–97)
Monocytes Absolute: 0.4 10*3/uL (ref 0.1–0.9)
Monocytes: 7 %
Neutrophils Absolute: 3 10*3/uL (ref 1.4–7.0)
Neutrophils: 56 %
Platelets: 204 10*3/uL (ref 150–450)
RBC: 5.6 x10E6/uL — ABNORMAL HIGH (ref 3.77–5.28)
RDW: 12.4 % (ref 11.7–15.4)
WBC: 5.5 10*3/uL (ref 3.4–10.8)

## 2022-11-24 LAB — B12 AND FOLATE PANEL
Folate: >20 ng/mL (ref >3.0)
Vitamin B-12: 757 pg/mL (ref 232–1245)

## 2022-12-04 ENCOUNTER — Telehealth: Payer: Self-pay

## 2022-12-04 ENCOUNTER — Other Ambulatory Visit: Payer: Self-pay | Admitting: Physician Assistant

## 2022-12-04 DIAGNOSIS — K140 Glossitis: Secondary | ICD-10-CM

## 2022-12-04 MED ORDER — DIPHENHYDRAMINE HCL 12.5 MG/5ML PO LIQD
5.0000 mL | Freq: Three times a day (TID) | ORAL | 0 refills | Status: DC
Start: 2022-12-04 — End: 2022-12-04

## 2022-12-04 NOTE — Telephone Encounter (Signed)
Patient called to leave a message to let you know that she did get in touch with her ENT and that the quickest they could get her in was July. She states that if you think a different ENT could get her in sooner that she will be glad to let you refer her some where else but just wanted to let you know.

## 2022-12-04 NOTE — Telephone Encounter (Signed)
Patient informed, patient states she will try to call around and see but if not she will stick with the appointment that she has in July and also requesting if you can send a refill of the mouthwash that you sent her in because she stated that it did help some. She wants it to go to [facility] in [place].

## 2022-12-04 NOTE — Telephone Encounter (Signed)
It can take awhile to get into specialist -- she could call around other offices and would be glad to make referral if she finds one that can make sooner appt

## 2022-12-04 NOTE — Telephone Encounter (Signed)
done

## 2022-12-08 MED ORDER — NYSTATIN 100000 UNIT/ML MT SUSP: 5.0000 mL | Freq: Three times a day (TID) | OROMUCOSAL | 0 refills | Status: DC

## 2023-01-04 ENCOUNTER — Other Ambulatory Visit: Payer: Self-pay | Admitting: Physician Assistant

## 2023-01-04 DIAGNOSIS — L4050 Arthropathic psoriasis, unspecified: Secondary | ICD-10-CM

## 2023-01-04 DIAGNOSIS — L409 Psoriasis, unspecified: Secondary | ICD-10-CM

## 2023-01-04 DIAGNOSIS — Z79899 Other long term (current) drug therapy: Secondary | ICD-10-CM

## 2023-01-04 NOTE — Telephone Encounter (Signed)
Last Fill: 09/28/2022  Labs: 11/23/2022 RBC 5.60, Hemoglobin 16.6, Hematocrit 49.3,   TB Gold: 10/01/2022  NEGATIVE   Next Visit: 02/17/2023  Last Visit: 09/02/2022  [id number] arthritis   Current Dose per office note 09/02/2022: Taltz 80 mg sq injections every 28 days   Okay to refill Taltz?

## 2023-02-03 NOTE — Progress Notes (Unsigned)
Office Visit Note  Patient: Kaitlyn Hardy             Date of Birth: 11-Jul-1971           MRN: [id number]             PCP: Marianne Sofia, PA-C Referring: Lurline Del, FNP Visit Date: 02/17/2023 Occupation: @GUAROCC @  Subjective:  Left thumb and index skin changes   History of Present Illness: Kaitlyn Hardy is a 52 y.o. female with history of psoriatic arthritis.  Patient is currently on Taltz 80 mg sq injections every 28 days.  She continues to tolerate Taltz without any side effects or injection site reactions.  Patient states that she was evaluated by dermatology and has been using triamcinolone cream topically on the anterior aspect of both shins which has helped with the redness and itching significantly.  Patient states that for the past several months she has been experiencing cracking and peeling of the skin around her left thumb and index finger.  She has also noticed nail changes.  She is not able to see the dermatologist again until the end of September 2024. She denies any increased joint pain or joint swelling.  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  She denies any Achilles tendinitis or plantar fasciitis.  She denies any SI joint pain.  Activities of Daily Living:  Patient reports morning stiffness for 0 minutes.   Patient Denies nocturnal pain.  Difficulty dressing/grooming: Denies Difficulty climbing stairs: Denies Difficulty getting out of chair: Denies Difficulty using hands for taps, buttons, cutlery, and/or writing: Reports  Review of Systems  Constitutional:  Negative for fatigue.  HENT:  Positive for sore tongue. Negative for mouth sores and mouth dryness.   Eyes:  Positive for dryness. Negative for pain, redness, itching and visual disturbance.  Respiratory:  Negative for shortness of breath.   Cardiovascular:  Negative for chest pain and palpitations.  Gastrointestinal:  Negative for blood in stool, constipation  and diarrhea.  Endocrine: Negative for increased urination.  Genitourinary:  Negative for involuntary urination.  Musculoskeletal:  Negative for joint pain, gait problem, joint pain, joint swelling, myalgias, muscle weakness, morning stiffness, muscle tenderness and myalgias.  Skin:  Positive for color change. Negative for rash, hair loss and sensitivity to sunlight.  Allergic/Immunologic: Positive for susceptible to infections.  Neurological:  Negative for dizziness and headaches.  Hematological:  Negative for swollen glands.  Psychiatric/Behavioral:  Negative for depressed mood and sleep disturbance. The patient is not nervous/anxious.     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   Fibroid 06/26/2013    Past Medical History:  Diagnosis Date   Allergy    Arthritis    psoriasis    Encounter for annual physical exam 08/26/2022   Factor V Leiden (HCC)    recent  dx - single, no problems   OAB (overactive bladder)    SVD (spontaneous vaginal delivery)    x 2   Vitamin D deficiency  Family History  Problem Relation Age of Onset   Pancreatic cancer Mother    Diabetes Mother    Thyroid disease Mother    Fibromyalgia Mother    Cancer Mother    Prostate cancer Father    Cancer Father    Healthy Son    Colon cancer Neg Hx    Rectal cancer Neg Hx    Stomach cancer Neg Hx    Past Surgical History:  Procedure Laterality Date   ABDOMINAL HYSTERECTOMY  partial   2014   ABLATION     done in MD's office    AUGMENTATION  MAMMAPLASTY Bilateral 02/25/1999   saline   BREAST SURGERY  02/1999   augmentation    LAPAROSCOPIC ASSISTED VAGINAL HYSTERECTOMY N/A 06/26/2013   Procedure: LAPAROSCOPIC ASSISTED VAGINAL HYSTERECTOMY;  Surgeon: Zelphia Cairo, MD;  Location: [facility];  Service: Gynecology;  Laterality: N/A;   WISDOM TOOTH EXTRACTION     Social History   Social History Narrative   Not on file    There is no immunization history on file for this patient.   Objective: Vital Signs: BP 130/83 (BP Location: Left Arm, Patient Position: Sitting, Cuff Size: Normal)   Pulse 80   Resp 16   Ht 5\' 5"  (1.651 m)   Wt 159 lb 12.8 oz (72.5 kg)   LMP 05/07/2013 (LMP Unknown)   BMI 26.59 kg/m    Physical Exam Vitals and nursing note reviewed.  Constitutional:      Appearance: She is well-developed.  HENT:     Head: Normocephalic and atraumatic.  Eyes:     Conjunctiva/sclera: Conjunctivae normal.  Cardiovascular:     Rate and Rhythm: Normal rate and regular rhythm.     Heart sounds: Normal heart sounds.  Pulmonary:     Effort: Pulmonary effort is normal.     Breath sounds: Normal breath sounds.  Abdominal:     General: Bowel sounds are normal.     Palpations: Abdomen is soft.  Musculoskeletal:     Cervical back: Normal range of motion.  Lymphadenopathy:     Cervical: No cervical adenopathy.  Skin:    General: Skin is warm and dry.     Capillary Refill: Capillary refill takes less than 2 seconds.     Comments: Nail dystrophy noted in several fingernails, most severe in the left thumbnail and left index.  Right thumbnail is also affected.   Erythema noted on anterior surface of both lower legs.    Neurological:     Mental Status: She is alert and oriented to person, place, and time.  Psychiatric:        Behavior: Behavior normal.      Musculoskeletal Exam: C-spine, thoracic spine, and lumbar spine good ROM.  No SI joint tenderness.  Shoulder joints, elbow joints, and wrist joints have good ROM.   Erythema and mild inflammation in the left index DIP joint. PIP and DIP thickening.  Complete fist formation bilaterally.  Hip joints have good ROM with no groin pain. Knee joints have good ROM with no warmth or effusion.  Pes cavus noted bilaterally.  No evidence of achilles tendonitis or plantar fasciitis.  No tenderness or synovitis of MTP joints.   CDAI Exam: CDAI Score: -- Patient Global: --; Provider Global: -- Swollen: --; Tender: -- Joint Exam 02/17/2023   No joint exam has been documented for this visit   There is currently no information documented on the homunculus. Go to the Rheumatology activity and complete the homunculus  joint exam.  Investigation: No additional findings.  Imaging: No results found.  Recent Labs: Lab Results  Component Value Date   WBC 5.5 11/23/2022   HGB 16.6 (H) 11/23/2022   PLT 204 11/23/2022   NA 142 11/18/2022   K 4.9 11/18/2022   CL 104 11/18/2022   CO2 20 11/18/2022   GLUCOSE 94 11/18/2022   BUN 14 11/18/2022   CREATININE 0.66 11/18/2022   BILITOT 0.5 11/18/2022   ALKPHOS 62 11/18/2022   AST 17 11/18/2022   ALT 18 11/18/2022   PROT 6.1 11/18/2022   ALBUMIN 4.2 11/18/2022   CALCIUM 9.0 11/18/2022   GFRAA >60 08/28/2019   QFTBGOLDPLUS Negative 11/18/2022    Speciality Comments: MTX-fatigue,Cosentyx-diarrhea, Humira-inadequate response,  Procedures:  No procedures performed Allergies: Patient has no known allergies.   Assessment / Plan:     Visit Diagnoses: Psoriatic arthritis (HCC) - Dx at [place] [facility]-09/2019: Patient presents today with severe nail dystrophy noted in the right thumbnail, left thumbnail, and left index finger.  Surrounding skin cracking and peeling noted in the left thumb and left index finger.  Erythema and mild inflammation noted in the DIP of the left index finger.  She is currently on taltz 80 mg sq injections every 28 days.  She is tolerating taltz without any side effects or injection site reactions.  Patient  experiences periodic infections at which time she holds Taltz until the infections completely clear.  Different treatment options were discussed today.  Discussed the apprehension of further suppressing her immune system.  Discussed that she may benefit from adding on Otezla as combination therapy.  She previously tolerated Otezla without any side effects.  Indications, contraindications, and potential side effects of [place] were discussed today in detail.  All questions were addressed and consent was updated.  Plan to apply for [place] through her insurance.  She will remain on Taltz as prescribed.  She will follow-up in the office in 3 months or sooner if needed.  Counseled patient that [place] is a PDE 4 inhibitor that works to treat psoriasis and the joint pain and tenderness of psoriatic arthritis.  Counseled patient on purpose, proper use, and adverse effects of Otezla.  Reviewed the most common adverse effects of weight loss, depression, nausea/diarrhea/vomiting, headaches, and nasal congestion.  Advised patient to notify office of any serious changes in mood and/or thoughts of suicide.  Provided patient with medication education material and answered all questions.  Patient consented to [place].    Patient dose will be Otezla starter titration pack and then 30 mg twice daily.  Prescription pending insurance approval and once approved patient may pick up sample for starter pack from our office.  Psoriasis - Hyperkeratosis, hyperpigmentation, and diffuse erythema noted on the anterior aspect of both shins distal to both knees--improving with the use of triamcinolone cream 0.1%.  Under care of dermatology-next visit isn't scheduled until 04/26/23.  She remains on Taltz 80 mg sq injections every 28 days.  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  Surrounding skin peeling and cracking noted in the left thumb and left index  finger.  Care instructions were provided.  Discussed the use of topical agents.  Patient was encouraged to keep follow-up with dermatology and to try to get on a cancellation list. Plan to try adding on Otezla as combination therapy.  High risk medication use - Taltz 80 mg sq injections every 28 days.  Altamease [place] was restarted on  06/06/2022.  (Taltz 80 mg-discontinue July 2023 initiated in March 2022.). Adding on otezla 30 mg 1 tablet by mouth twice daily.  CBC and CMP updated on 11/18/22. Orders for CBC and CMP released today. Her next lab work will be due in October and every 3 months. TB gold negative on 11/18/22.  Discussed the importance of holding taltz if she develops signs or symptoms of an infection and to resume once the infection has completely cleared.  Encouraged patient to start taking a vitamin C and zinc supplement due to help prevent recurrent infections.    - Plan: CBC with Differential/Platelet, COMPLETE METABOLIC PANEL WITH GFR  Primary osteoarthritis of both hands: PIP and DIP thickening.  Erythema and mild inflammation noted in the DIP of the left index finger.   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  The right elbow joint contractures unchanged.  No tenderness or inflammation noted.   Primary osteoarthritis of both feet: No synovitis or dactylitis noted.   Pes cavus of both feet: Unchanged.  No evidence of achilles tendonitis or plantar fasciitis.   Vitamin D deficiency: She is taking a daily vitamin D supplement.   Other fatigue  Seasonal allergies  Factor V Leiden carrier (HCC) - per patient.  Orders: Orders Placed This Encounter  Procedures   CBC with Differential/Platelet   COMPLETE METABOLIC PANEL WITH GFR   No orders of the defined types were placed in this encounter.   Follow-Up Instructions: Return in about 3 months (around 05/20/2023) for Psoriatic arthritis.   Gearldine Bienenstock, PA-C  Note - This record has been created  using Dragon software.  Chart creation errors have been sought, but may not always  have been located. Such creation errors do not reflect on  the standard of medical care.

## 2023-02-17 ENCOUNTER — Ambulatory Visit: Payer: 59 | Attending: Rheumatology | Admitting: Physician Assistant

## 2023-02-17 ENCOUNTER — Encounter: Payer: Self-pay | Admitting: Physician Assistant

## 2023-02-17 VITALS — BP 130/83 | HR 80 | Resp 16 | Ht 65.0 in | Wt 159.8 lb

## 2023-02-17 DIAGNOSIS — M19041 Primary osteoarthritis, right hand: Secondary | ICD-10-CM | POA: Diagnosis not present

## 2023-02-17 DIAGNOSIS — D6851 Activated protein C resistance: Secondary | ICD-10-CM

## 2023-02-17 DIAGNOSIS — M19072 Primary osteoarthritis, left ankle and foot: Secondary | ICD-10-CM

## 2023-02-17 DIAGNOSIS — E559 Vitamin D deficiency, unspecified: Secondary | ICD-10-CM

## 2023-02-17 DIAGNOSIS — R5383 Other fatigue: Secondary | ICD-10-CM

## 2023-02-17 DIAGNOSIS — Q6672 Congenital pes cavus, left foot: Secondary | ICD-10-CM

## 2023-02-17 DIAGNOSIS — L4050 Arthropathic psoriasis, unspecified: Secondary | ICD-10-CM

## 2023-02-17 DIAGNOSIS — L409 Psoriasis, unspecified: Secondary | ICD-10-CM | POA: Diagnosis not present

## 2023-02-17 DIAGNOSIS — J302 Other seasonal allergic rhinitis: Secondary | ICD-10-CM

## 2023-02-17 DIAGNOSIS — Q6671 Congenital pes cavus, right foot: Secondary | ICD-10-CM

## 2023-02-17 DIAGNOSIS — M19071 Primary osteoarthritis, right ankle and foot: Secondary | ICD-10-CM

## 2023-02-17 DIAGNOSIS — M19042 Primary osteoarthritis, left hand: Secondary | ICD-10-CM

## 2023-02-17 DIAGNOSIS — Z79899 Other long term (current) drug therapy: Secondary | ICD-10-CM

## 2023-02-17 DIAGNOSIS — M24521 Contracture, right elbow: Secondary | ICD-10-CM

## 2023-02-17 LAB — CBC WITH DIFFERENTIAL/PLATELET
Absolute Monocytes: 525 cells/uL (ref 200–950)
Basophils Absolute: 71 cells/uL (ref 0–200)
HCT: 48.5 % — ABNORMAL HIGH (ref 35.0–45.0)
MCH: 29.8 pg (ref 27.0–33.0)
Neutro Abs: 4374 cells/uL (ref 1500–7800)
Platelets: 191 10*3/uL (ref 140–400)
RDW: 11.9 % (ref 11.0–15.0)

## 2023-02-17 NOTE — Patient Instructions (Addendum)
Standing Labs We placed an order today for your standing lab work.   Please have your standing labs drawn in October and every 3 months   Please have your labs drawn 2 weeks prior to your appointment so that the provider can discuss your lab results at your appointment, if possible.  Please note that you may see your imaging and lab results in MyChart before we have reviewed them. We will contact you once all results are reviewed. Please allow our office up to 72 hours to thoroughly review all of the results before contacting the office for clarification of your results.  WALK-IN LAB HOURS  Monday through Thursday from 8:00 am -12:30 pm and 1:00 pm-5:00 pm and Friday from 8:00 am-12:00 pm.  Patients with office visits requiring labs will be seen before walk-in labs.  You may encounter longer than normal wait times. Please allow additional time. Wait times may be shorter on  Monday and Thursday afternoons.  We do not book appointments for walk-in labs. We appreciate your patience and understanding with our staff.   Labs are drawn by [facility]. Please bring your co-pay at the time of your lab draw.  You may receive a bill from [facility] for your lab work.  Please note if you are on Hydroxychloroquine and and an order has been placed for a Hydroxychloroquine level,  you will need to have it drawn 4 hours or more after your last dose.  If you wish to have your labs drawn at another location, please call the office 24 hours in advance so we can fax the orders.  The office is located at [address], [place], [place] [address]   If you have any questions regarding directions or hours of operation,  please call [phone].   As a reminder, please drink plenty of water prior to coming for your lab work. Thanks!  Apremilast Tablets What is this medication? APREMILAST (a PRE mil ast) treats autoimmune conditions, such as arthritis and psoriasis. It may also be used to treat mouth ulcers  in people with a condition that causes blood vessel swelling (Behcet syndrome). It works by decreasing inflammation. This medicine may be used for other purposes; ask your health care provider or pharmacist if you have questions. COMMON BRAND NAME(S): Henderson [place] What should I tell my care team before I take this medication? They need to know if you have any of these conditions: Dehydration Depression Kidney disease Suicidal thoughts, plans, or attempt An unusual or allergic reaction to apremilast, other medications, foods, dyes, or preservatives Pregnant or trying to get pregnant Breastfeeding How should I use this medication? Take this medication by mouth with water. Take it as directed on the prescription label at the same time every day. Do not cut, crush, or chew this medication. Swallow the tablets whole. You can take it with or without food. If it upsets your stomach, take it with food. Keep taking it unless your care team tells you to stop. Talk to your care team about the use of this medication in children. While it may be prescribed for children as young as 6 years for selected conditions, precautions do apply. Overdosage: If you think you have taken too much of this medicine contact a poison control center or emergency room at once. NOTE: This medicine is only for you. Do not share this medicine with others. What if I miss a dose? If you miss a dose, take it as soon as you can. If it is  almost time for your next dose, take only that dose. Do not take double or extra doses. What may interact with this medication? Certain medications for seizures, such as carbamazepine, phenobarbital, phenytoin Rifampin Other medications may affect the way this medication works. Talk with your care team about all of the medications you take. They may suggest changes to your treatment plan to lower the risk of side effects and to make sure your medications work as intended. This list may not describe all  possible interactions. Give your health care provider a list of all the medicines, herbs, non-prescription drugs, or dietary supplements you use. Also tell them if you smoke, drink alcohol, or use illegal drugs. Some items may interact with your medicine. What should I watch for while using this medication? Visit your care team for regular checks on your progress. Tell your care team if your symptoms do not start to get better or if they get worse. This medication may cause thoughts of suicide or depression. This includes sudden changes in mood, behaviors, or thoughts. These changes can happen at any time but are more common in the beginning of treatment or after a change in dose. Call your care team right away if you experience these thoughts or worsening depression. Check with your care team if you have severe diarrhea, nausea, and vomiting, or if you sweat a lot. The loss of too much body fluid may make it dangerous for you to take this medication. Discuss the medication with your care team if you may be pregnant. There are benefits and risks to taking medications during pregnancy. Your care team can help you find the option that works for you. Talk to your care team before breastfeeding. Changes to your treatment plan may be needed. What side effects may I notice from receiving this medication? Side effects that you should report to your care team as soon as possible: Allergic reactions--skin rash, itching, hives, swelling of the face, lips, tongue, or throat Thoughts of suicide or self-harm, worsening mood, feelings of depression Side effects that usually do not require medical attention (report these to your care team if they continue or are bothersome): Diarrhea Headache Loss of appetite with weight loss Nausea Vomiting This list may not describe all possible side effects. Call your doctor for medical advice about side effects. You may report side effects to FDA at 1-800-FDA-1088. Where  should I keep my medication? Keep out of the reach of children and pets. Store below 30 degrees C (86 degrees F). Get rid of any unused medication after the expiration date. To get rid of medications that are no longer needed or have expired: Take the medication to a medication take-back program. Check with your pharmacy or law enforcement to find a location. If you cannot return the medication, check the label or package insert to see if the medication should be thrown out in the garbage or flushed down the toilet. If you are not sure, ask your care team. If it is safe to put it in the trash, take the medication out of the container. Mix the medication with cat litter, dirt, coffee grounds, or other unwanted substance. Seal the mixture in a bag or container. Put it in the trash. NOTE: This sheet is a summary. It may not cover all possible information. If you have questions about this medicine, talk to your doctor, pharmacist, or health care provider.  2024 Elsevier/Gold Standard (2022-11-24 00:00:00)

## 2023-02-17 NOTE — Progress Notes (Signed)
Pharmacy Note  Subjective:  Patient presents today to [facility] for follow up office visit.   Patient was seen by the pharmacist for counseling on Otezla for psoriatic arthritis and plaque psoriasis. She is currently on Taltz which is benefiting her Altamease Oiler but less so her psoriasis. She has previously taken [place] which worked well for psoriasis but did not work well for PsA  Objective: CMP     Component Value Date/Time   NA 142 11/18/2022 0828   K 4.9 11/18/2022 0828   CL 104 11/18/2022 0828   CO2 20 11/18/2022 0828   GLUCOSE 94 11/18/2022 0828   GLUCOSE 77 04/29/2022 1211   BUN 14 11/18/2022 0828   CREATININE 0.66 11/18/2022 0828   CREATININE 0.68 04/29/2022 1211   CALCIUM 9.0 11/18/2022 0828   PROT 6.1 11/18/2022 0828   ALBUMIN 4.2 11/18/2022 0828   AST 17 11/18/2022 0828   AST 16 08/28/2019 0846   ALT 18 11/18/2022 0828   ALT 14 08/28/2019 0846   ALKPHOS 62 11/18/2022 0828   BILITOT 0.5 11/18/2022 0828   BILITOT 0.5 08/28/2019 0846   GFRNONAA >60 08/28/2019 0846   GFRAA >60 08/28/2019 0846    CBC    Component Value Date/Time   WBC 5.5 11/23/2022 0923   WBC 7.8 04/29/2022 1211   RBC 5.60 (H) 11/23/2022 0923   RBC 5.50 (H) 04/29/2022 1211   HGB 16.6 (H) 11/23/2022 0923   HCT 49.3 (H) 11/23/2022 0923   PLT 204 11/23/2022 0923   MCV 88 11/23/2022 0923   MCH 29.6 11/23/2022 0923   MCH 30.2 04/29/2022 1211   MCHC 33.7 11/23/2022 0923   MCHC 34.2 04/29/2022 1211   RDW 12.4 11/23/2022 0923   LYMPHSABS 1.7 11/23/2022 0923   MONOABS 0.8 08/28/2019 0846   EOSABS 0.3 11/23/2022 0923   BASOSABS 0.1 11/23/2022 4098     Assessment/Plan:  Counseled patient that Henderson [place] is a PDE 4 inhibitor that works to treat psoriasis and the joint pain and tenderness of psoriatic arthritis.  Counseled patient on purpose, proper use, and adverse effects of Otezla.  Reviewed the most common adverse effects of weight loss, depression, nausea/diarrhea/vomiting, headaches, and  nasal congestion.  Advised patient to notify office of any serious changes in mood and/or thoughts of suicide.  Provided patient with medication education material and answered all questions.   Patient dose will be Otezla starter titration pack and then 30 mg twice daily. This will be managed by dermatology (she has f/u with [facility] at the end of September). Provided her with Henderson [place] information as well bridge program enrollment forms to provide to dermatology clinic  Chesley Mires, PharmD, MPH, BCPS, CPP Clinical Pharmacist (Rheumatology and Pulmonology)

## 2023-02-18 LAB — CBC WITH DIFFERENTIAL/PLATELET
Basophils Relative: 1 %
Eosinophils Absolute: 213 cells/uL (ref 15–500)
Eosinophils Relative: 3 %
Hemoglobin: 16.2 g/dL — ABNORMAL HIGH (ref 11.7–15.5)
Lymphs Abs: 1917 cells/uL (ref 850–3900)
MCHC: 33.4 g/dL (ref 32.0–36.0)
MCV: 89.2 fL (ref 80.0–100.0)
MPV: 11.2 fL (ref 7.5–12.5)
Monocytes Relative: 7.4 %
Neutrophils Relative %: 61.6 %
RBC: 5.44 10*6/uL — ABNORMAL HIGH (ref 3.80–5.10)
Total Lymphocyte: 27 %
WBC: 7.1 10*3/uL (ref 3.8–10.8)

## 2023-02-18 LAB — COMPLETE METABOLIC PANEL WITH GFR
AG Ratio: 2 (calc) (ref 1.0–2.5)
ALT: 18 U/L (ref 6–29)
AST: 23 U/L (ref 10–35)
Alkaline phosphatase (APISO): 45 U/L (ref 37–153)
BUN: 21 mg/dL (ref 7–25)
Calcium: 9.4 mg/dL (ref 8.6–10.4)
Chloride: 104 mmol/L (ref 98–110)
Glucose, Bld: 96 mg/dL (ref 65–99)
Potassium: 4.7 mmol/L (ref 3.5–5.3)
Sodium: 138 mmol/L (ref 135–146)
Total Bilirubin: 0.8 mg/dL (ref 0.2–1.2)
Total Protein: 6.5 g/dL (ref 6.1–8.1)
eGFR: 91 mL/min/{1.73_m2} (ref >=60)

## 2023-02-18 NOTE — Progress Notes (Signed)
CBC stable. CMP WNL.

## 2023-02-24 ENCOUNTER — Ambulatory Visit: Payer: 59 | Admitting: Rheumatology

## 2023-03-30 ENCOUNTER — Other Ambulatory Visit: Payer: Self-pay | Admitting: Physician Assistant

## 2023-03-30 DIAGNOSIS — L4050 Arthropathic psoriasis, unspecified: Secondary | ICD-10-CM

## 2023-03-30 DIAGNOSIS — Z79899 Other long term (current) drug therapy: Secondary | ICD-10-CM

## 2023-03-30 DIAGNOSIS — L409 Psoriasis, unspecified: Secondary | ICD-10-CM

## 2023-03-31 NOTE — Telephone Encounter (Signed)
Last Fill: 01/04/2023  Labs: 02/17/2023 CBC stable. CMP WNL   TB Gold: 11/18/2022 Neg    Next Visit: 05/24/2023  Last Visit: 02/17/2023  WU:[id number] arthritis   Current Dose per office note 02/17/2023: Taltz 80 mg sq injections every 28 days   Okay to refill Taltz?

## 2023-04-29 ENCOUNTER — Other Ambulatory Visit: Payer: Self-pay

## 2023-04-29 DIAGNOSIS — N39498 Other specified urinary incontinence: Secondary | ICD-10-CM

## 2023-04-29 MED ORDER — SOLIFENACIN SUCCINATE 10 MG PO TABS
10.0000 mg | ORAL_TABLET | Freq: Every day | ORAL | 0 refills | Status: DC
Start: 2023-04-29 — End: 2023-06-14

## 2023-04-30 ENCOUNTER — Other Ambulatory Visit: Payer: Self-pay | Admitting: Physician Assistant

## 2023-04-30 DIAGNOSIS — Z1212 Encounter for screening for malignant neoplasm of rectum: Secondary | ICD-10-CM

## 2023-04-30 DIAGNOSIS — Z1211 Encounter for screening for malignant neoplasm of colon: Secondary | ICD-10-CM

## 2023-05-10 NOTE — Progress Notes (Deleted)
Office Visit Note  Patient: Kaitlyn Hardy             Date of Birth: 09-07-1970           MRN: [id number]             PCP: Marianne Sofia, PA-C Referring: Marianne Sofia, PA-C Visit Date: 05/24/2023 Occupation: @GUAROCC @  Subjective:  No chief complaint on file.   History of Present Illness: Kaitlyn Hardy is a 52 y.o. female ***     Activities of Daily Living:  Patient reports morning stiffness for *** {minute/hour:19697}.   Patient {ACTIONS;DENIES/REPORTS:21021675::"Denies"} nocturnal pain.  Difficulty dressing/grooming: {ACTIONS;DENIES/REPORTS:21021675::"Denies"} Difficulty climbing stairs: {ACTIONS;DENIES/REPORTS:21021675::"Denies"} Difficulty getting out of chair: {ACTIONS;DENIES/REPORTS:21021675::"Denies"} Difficulty using hands for taps, buttons, cutlery, and/or writing: {ACTIONS;DENIES/REPORTS:21021675::"Denies"}  No Rheumatology ROS completed.   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   Fibroid 06/26/2013    Past Medical History:  Diagnosis Date   Allergy    Arthritis    psoriasis    Encounter for annual physical exam 08/26/2022   Factor V Leiden (HCC)    recent  dx - single, no problems   OAB (overactive bladder)    SVD (spontaneous vaginal delivery)    x 2   Vitamin D deficiency     Family History  Problem Relation Age of Onset   Pancreatic cancer Mother    Diabetes Mother    Thyroid disease Mother    Fibromyalgia Mother    Cancer Mother    Prostate cancer Father    Cancer Father     Healthy Son    Colon cancer Neg Hx    Rectal cancer Neg Hx    Stomach cancer Neg Hx    Past Surgical History:  Procedure Laterality Date   ABDOMINAL HYSTERECTOMY  partial   2014   ABLATION     done in MD's office    AUGMENTATION MAMMAPLASTY Bilateral 02/25/1999   saline   BREAST SURGERY  02/1999   augmentation    LAPAROSCOPIC ASSISTED VAGINAL HYSTERECTOMY N/A 06/26/2013   Procedure: LAPAROSCOPIC ASSISTED VAGINAL HYSTERECTOMY;  Surgeon: Zelphia Cairo, MD;  Location: [facility];  Service: Gynecology;  Laterality: N/A;   WISDOM TOOTH EXTRACTION     Social History   Social History Narrative   Not on file    There is no immunization history on file for this patient.   Objective: Vital Signs: LMP 05/07/2013 (LMP Unknown)    Physical Exam   Musculoskeletal Exam: ***  CDAI Exam: CDAI Score: -- Patient Global: --; Provider Global: -- Swollen: --; Tender: -- Joint Exam 05/24/2023   No joint exam has been documented for this visit   There is currently no information documented on the homunculus. Go to the Rheumatology activity and complete the homunculus joint exam.  Investigation: No additional findings.  Imaging: No results found.  Recent Labs: Lab Results  Component Value Date   WBC 7.1 02/17/2023   HGB 16.2 (H) 02/17/2023   PLT 191 02/17/2023   NA 138 02/17/2023   K 4.7 02/17/2023  CL 104 02/17/2023   CO2 28 02/17/2023   GLUCOSE 96 02/17/2023   BUN 21 02/17/2023   CREATININE 0.78 02/17/2023   BILITOT 0.8 02/17/2023   ALKPHOS 62 11/18/2022   AST 23 02/17/2023   ALT 18 02/17/2023   PROT 6.5 02/17/2023   ALBUMIN 4.2 11/18/2022   CALCIUM 9.4 02/17/2023   GFRAA >60 08/28/2019   QFTBGOLDPLUS Negative 11/18/2022    Speciality Comments: MTX-fatigue,Cosentyx-diarrhea, Humira-inadequate response,  Procedures:  No procedures performed Allergies: Patient has no known allergies.   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  No orders of the defined types were placed in this encounter.   Face-to-face time spent with patient was *** minutes. Greater than 50% of time was spent in counseling and coordination of care.  Follow-Up Instructions: No follow-ups on file.   Gearldine Bienenstock, PA-C  Note - This record has been created using Dragon software.  Chart creation errors have been sought, but may not always  have been located. Such creation errors do not reflect on  the standard of medical care.

## 2023-05-24 ENCOUNTER — Ambulatory Visit: Payer: 59 | Admitting: Physician Assistant

## 2023-05-24 DIAGNOSIS — Z79899 Other long term (current) drug therapy: Secondary | ICD-10-CM

## 2023-05-24 DIAGNOSIS — J302 Other seasonal allergic rhinitis: Secondary | ICD-10-CM

## 2023-05-24 DIAGNOSIS — D6851 Activated protein C resistance: Secondary | ICD-10-CM

## 2023-05-24 DIAGNOSIS — E559 Vitamin D deficiency, unspecified: Secondary | ICD-10-CM

## 2023-05-24 DIAGNOSIS — M24521 Contracture, right elbow: Secondary | ICD-10-CM

## 2023-05-24 DIAGNOSIS — M19071 Primary osteoarthritis, right ankle and foot: Secondary | ICD-10-CM

## 2023-05-24 DIAGNOSIS — R5383 Other fatigue: Secondary | ICD-10-CM

## 2023-05-24 DIAGNOSIS — L4050 Arthropathic psoriasis, unspecified: Secondary | ICD-10-CM

## 2023-05-24 DIAGNOSIS — L409 Psoriasis, unspecified: Secondary | ICD-10-CM

## 2023-05-24 DIAGNOSIS — Q6671 Congenital pes cavus, right foot: Secondary | ICD-10-CM

## 2023-05-24 DIAGNOSIS — M19041 Primary osteoarthritis, right hand: Secondary | ICD-10-CM

## 2023-05-24 NOTE — Progress Notes (Unsigned)
Office Visit Note  Patient: Kaitlyn Hardy             Date of Birth: [birth year]           MRN: [id number]             PCP: Marianne Sofia, PA-C Referring: Marianne Sofia, PA-C Visit Date: 05/25/2023 Occupation: @GUAROCC @  Subjective:  Psoriasis   History of Present Illness: Kaitlyn Hardy is a 52 y.o. female with history of psoriatic arthritis and osteoarthritis.  Patient remains on Taltz 80 mg sq injections once monthly.  She continues to tolerate Taltz without any side effects or injection site reactions.  She has not missed any doses of Taltz recently.  She denies any Achilles tendinitis or plantar fasciitis.  She has not had any SI joint pain.  She experiences intermittent arthralgias and takes Advil as needed.  She has occasional pain in her left thumb which she attributes to repetitive or overuse activities. She is prescribed vtama and clobetasol ointment by dermatology management of psoriasis. She is currently having a flare involving several digits.  Patient is planning on following a gluten free and diary free diet, which has been helpful at managing her psoriasis in the past.  Activities of Daily Living:  Patient reports morning stiffness for 0  none .   Patient Denies nocturnal pain.  Difficulty dressing/grooming: Denies Difficulty climbing stairs: Denies Difficulty getting out of chair: Denies Difficulty using hands for taps, buttons, cutlery, and/or writing: Denies  Review of Systems  Constitutional:  Positive for fatigue.  HENT:  Positive for mouth sores. Negative for mouth dryness.   Eyes:  Negative for dryness.  Respiratory:  Negative for shortness of breath.   Cardiovascular:  Negative for chest pain and palpitations.  Gastrointestinal:  Negative for blood in stool, constipation and diarrhea.  Endocrine: Positive for increased urination.  Genitourinary:  Negative for involuntary urination.  Musculoskeletal:  Negative for joint pain, gait problem, joint pain,  joint swelling, myalgias, muscle weakness, morning stiffness, muscle tenderness and myalgias.  Skin:  Positive for rash and sensitivity to sunlight. Negative for color change and hair loss.  Allergic/Immunologic: Negative for susceptible to infections.  Neurological:  Negative for dizziness, numbness, headaches and weakness.  Hematological:  Negative for swollen glands.  Psychiatric/Behavioral:  Negative for depressed mood and sleep disturbance. The patient is not nervous/anxious.     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   Fibroid 06/26/2013    Past Medical History:  Diagnosis Date   Allergy    Arthritis    psoriasis    Encounter for annual physical exam 08/26/2022   Factor V Leiden (HCC)    recent  dx - single, no problems   OAB (overactive bladder)    SVD (spontaneous vaginal delivery)    x 2   Vitamin D deficiency     Family History  Problem Relation Age of Onset   Pancreatic cancer Mother    Diabetes Mother    Thyroid disease Mother    Fibromyalgia Mother  Cancer Mother    Prostate cancer Father    Cancer Father    Healthy Son    Colon cancer Neg Hx    Rectal cancer Neg Hx    Stomach cancer Neg Hx    Past Surgical History:  Procedure Laterality Date   ABDOMINAL HYSTERECTOMY  partial   2014   ABLATION     done in MD's office    AUGMENTATION MAMMAPLASTY Bilateral 02/25/1999   saline   BREAST SURGERY  02/1999   augmentation    LAPAROSCOPIC ASSISTED VAGINAL HYSTERECTOMY N/A 06/26/2013   Procedure:  LAPAROSCOPIC ASSISTED VAGINAL HYSTERECTOMY;  Surgeon: Zelphia Cairo, MD;  Location: [facility];  Service: Gynecology;  Laterality: N/A;   WISDOM TOOTH EXTRACTION     Social History   Social History Narrative   Not on file    There is no immunization history on file for this patient.   Objective: Vital Signs: BP 119/80 (BP Location: Left Arm, Patient Position: Sitting, Cuff Size: Normal)   Pulse 69   Resp 14   Ht 5' 4.5" (1.638 m)   Wt 161 lb (73 kg)   LMP 05/07/2013 (LMP Unknown)   BMI 27.21 kg/m    Physical Exam Vitals and nursing note reviewed.  Constitutional:      Appearance: She is well-developed.  HENT:     Head: Normocephalic and atraumatic.  Eyes:     Conjunctiva/sclera: Conjunctivae normal.  Cardiovascular:     Rate and Rhythm: Normal rate and regular rhythm.     Heart sounds: Normal heart sounds.  Pulmonary:     Effort: Pulmonary effort is normal.     Breath sounds: Normal breath sounds.  Abdominal:     General: Bowel sounds are normal.     Palpations: Abdomen is soft.  Musculoskeletal:     Cervical back: Normal range of motion.  Lymphadenopathy:     Cervical: No cervical adenopathy.  Skin:    General: Skin is warm and dry.     Capillary Refill: Capillary refill takes less than 2 seconds.     Comments: Diffuse erythema noted on anterior shin.  Skin cracking and peeling noted overlying left thumb and left index fingertip.   Neurological:     Mental Status: She is alert and oriented to person, place, and time.  Psychiatric:        Behavior: Behavior normal.      Musculoskeletal Exam: C-spine, thoracic spine, and lumbar spine good ROM.  No midline spinal tenderness.  No SI joint tenderness.  Shoulder joints, elbow joints, wrist joints, MCPs, PIPs, and DIPs good ROM with no synovitis.  PIP and DIP thickening.  Tenderness and mild thickening of the left 1st MCP joint.  Complete fist formation bilaterally.  Hip joints have good ROM with no groin pain.  Knee  joints have good ROM with no tenderness or joint swelling.  Ankle joints have good ROM with no tenderness or swelling.  No evidence of achilles tendonitis or plantar fasciitis. Pes cavus bilaterally.  No tenderness or synovitis of MTP joints.   CDAI Exam: CDAI Score: -- Patient Global: --; Provider Global: -- Swollen: --; Tender: -- Joint Exam 05/25/2023   No joint exam has been documented for this visit   There is currently no information documented on the homunculus. Go to the Rheumatology activity and complete the homunculus joint exam.  Investigation: No additional findings.  Imaging: No results found.  Recent Labs: Lab Results  Component Value Date   WBC 7.1 02/17/2023  HGB 16.2 (H) 02/17/2023   PLT 191 02/17/2023   NA 138 02/17/2023   K 4.7 02/17/2023   CL 104 02/17/2023   CO2 28 02/17/2023   GLUCOSE 96 02/17/2023   BUN 21 02/17/2023   CREATININE 0.78 02/17/2023   BILITOT 0.8 02/17/2023   ALKPHOS 62 11/18/2022   AST 23 02/17/2023   ALT 18 02/17/2023   PROT 6.5 02/17/2023   ALBUMIN 4.2 11/18/2022   CALCIUM 9.4 02/17/2023   GFRAA >60 08/28/2019   QFTBGOLDPLUS Negative 11/18/2022    Speciality Comments: MTX-fatigue,Cosentyx-diarrhea, Humira-inadequate response,  Procedures:  No procedures performed Allergies: Patient has no known allergies.   Assessment / Plan:     Visit Diagnoses: Psoriatic arthritis (HCC): Dx at [place] [facility]-09/2019--She has no synovitis or dactylitis on examination today.  No evidence of Achilles tendinitis or plantar fasciitis.  No SI joint tenderness upon palpation today.  Patient is clinically doing well on Taltz 80 mg sq injections every 28 days.  She continues to tolerate Taltz without any side effects or injection site reactions.  She has not had any interruptions in therapy recently.  She is been experiencing less frequent infections. Patient has prescription for Vtama and clobetasol ointment which she can apply topically as needed for  outbreaks of psoriasis.   Patient will remain on Taltz as prescribed.  She was vies notify us if she develops signs or symptoms of a flare.  She will follow-up in the office in 5 months or sooner if needed.  Psoriasis - History of hyperkeratosis, hyperpigmentation, and diffuse erythema noted on the anterior aspect of both shins distal to both knees--Improved with topical agents and dietary changes.  She has some residual erythema on the anterior aspect of the right shin. She currently has cracking and peeling of the skin on her left thumb and index finger tip.  She has been prescribed vtama and clobetasol by dermatology to apply as needed.   High risk medication use - Taltz 80 mg sq inj q28 days. restarted 06/06/2022.Altamease Oiler 80 mg-d/c July 2023 initiated in March 2022.). CBC and CMP updated on 02/17/23.  Orders for CBC and CMP released today. Her next lab work will be due in January and every 3 months to monitor for drug toxicity. TB Gold negative 11/18/22.   No recent or recurrent infections. Discussed the importance of holding taltz if she develops signs or symptoms of an infection and to resume once the infection has completely cleared.  She has been taking vitamin C and a multivitamin. - Plan: COMPLETE METABOLIC PANEL WITH GFR, CBC with Differential/Platelet  Primary osteoarthritis of both hands: PIP and DIP thickening consistent with osteoarthritis of both hands.  No synovitis noted.   Contracture of right elbow: No tenderness or inflammation noted.  Primary osteoarthritis of both feet: No tenderness or synovitis over MTP joints.  Good range of motion of both ankle joints with no tenderness or inflammation.  Pes cavus of both feet: No evidence of Achilles tendinitis or plantar fasciitis.  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    Follow-Up Instructions: Return in about 5 months (around 10/23/2023) for Psoriatic arthritis, Osteoarthritis.   Gearldine Bienenstock, PA-C  Note - This record has  been created using [employer].  Chart creation errors have been sought, but may not always  have been located. Such creation errors do not reflect on  the standard of medical care.

## 2023-05-25 ENCOUNTER — Ambulatory Visit: Payer: 59 | Attending: Physician Assistant | Admitting: Physician Assistant

## 2023-05-25 ENCOUNTER — Encounter: Payer: Self-pay | Admitting: Physician Assistant

## 2023-05-25 VITALS — BP 119/80 | HR 69 | Resp 14 | Ht 64.5 in | Wt 161.0 lb

## 2023-05-25 DIAGNOSIS — M19072 Primary osteoarthritis, left ankle and foot: Secondary | ICD-10-CM

## 2023-05-25 DIAGNOSIS — J302 Other seasonal allergic rhinitis: Secondary | ICD-10-CM

## 2023-05-25 DIAGNOSIS — E559 Vitamin D deficiency, unspecified: Secondary | ICD-10-CM

## 2023-05-25 DIAGNOSIS — L4050 Arthropathic psoriasis, unspecified: Secondary | ICD-10-CM | POA: Diagnosis not present

## 2023-05-25 DIAGNOSIS — L409 Psoriasis, unspecified: Secondary | ICD-10-CM

## 2023-05-25 DIAGNOSIS — Q6671 Congenital pes cavus, right foot: Secondary | ICD-10-CM

## 2023-05-25 DIAGNOSIS — M24521 Contracture, right elbow: Secondary | ICD-10-CM

## 2023-05-25 DIAGNOSIS — M19071 Primary osteoarthritis, right ankle and foot: Secondary | ICD-10-CM

## 2023-05-25 DIAGNOSIS — D6851 Activated protein C resistance: Secondary | ICD-10-CM

## 2023-05-25 DIAGNOSIS — M19041 Primary osteoarthritis, right hand: Secondary | ICD-10-CM | POA: Diagnosis not present

## 2023-05-25 DIAGNOSIS — M19042 Primary osteoarthritis, left hand: Secondary | ICD-10-CM

## 2023-05-25 DIAGNOSIS — Q6672 Congenital pes cavus, left foot: Secondary | ICD-10-CM

## 2023-05-25 DIAGNOSIS — R5383 Other fatigue: Secondary | ICD-10-CM

## 2023-05-25 DIAGNOSIS — Z79899 Other long term (current) drug therapy: Secondary | ICD-10-CM | POA: Diagnosis not present

## 2023-05-25 NOTE — Patient Instructions (Signed)
Standing Labs We placed an order today for your standing lab work.   Please have your standing labs drawn at end January and every 3 months   Please have your labs drawn 2 weeks prior to your appointment so that the provider can discuss your lab results at your appointment, if possible.  Please note that you may see your imaging and lab results in MyChart before we have reviewed them. We will contact you once all results are reviewed. Please allow our office up to 72 hours to thoroughly review all of the results before contacting the office for clarification of your results.  WALK-IN LAB HOURS  Monday through Thursday from 8:00 am -12:30 pm and 1:00 pm-5:00 pm and Friday from 8:00 am-12:00 pm.  Patients with office visits requiring labs will be seen before walk-in labs.  You may encounter longer than normal wait times. Please allow additional time. Wait times may be shorter on  Monday and Thursday afternoons.  We do not book appointments for walk-in labs. We appreciate your patience and understanding with our staff.   Labs are drawn by [facility]. Please bring your co-pay at the time of your lab draw.  You may receive a bill from [facility] for your lab work.  Please note if you are on Hydroxychloroquine and and an order has been placed for a Hydroxychloroquine level,  you will need to have it drawn 4 hours or more after your last dose.  If you wish to have your labs drawn at another location, please call the office 24 hours in advance so we can fax the orders.  The office is located at [address], [place], [place] [address]   If you have any questions regarding directions or hours of operation,  please call [phone].   As a reminder, please drink plenty of water prior to coming for your lab work. Thanks!

## 2023-05-26 LAB — CBC WITH DIFFERENTIAL/PLATELET
Absolute Lymphocytes: 1736 {cells}/uL (ref 850–3900)
Absolute Monocytes: 694 {cells}/uL (ref 200–950)
Basophils Absolute: 68 {cells}/uL (ref 0–200)
Basophils Relative: 1.1 %
Eosinophils Absolute: 161 {cells}/uL (ref 15–500)
Eosinophils Relative: 2.6 %
HCT: 49.2 % — ABNORMAL HIGH (ref 35.0–45.0)
Hemoglobin: 16.1 g/dL — ABNORMAL HIGH (ref 11.7–15.5)
MCH: 29 pg (ref 27.0–33.0)
MCHC: 32.7 g/dL (ref 32.0–36.0)
MCV: 88.5 fL (ref 80.0–100.0)
MPV: 10.9 fL (ref 7.5–12.5)
Monocytes Relative: 11.2 %
Neutro Abs: 3540 {cells}/uL (ref 1500–7800)
Neutrophils Relative %: 57.1 %
Platelets: 194 10*3/uL (ref 140–400)
RBC: 5.56 10*6/uL — ABNORMAL HIGH (ref 3.80–5.10)
RDW: 11.8 % (ref 11.0–15.0)
Total Lymphocyte: 28 %
WBC: 6.2 10*3/uL (ref 3.8–10.8)

## 2023-05-26 LAB — COMPLETE METABOLIC PANEL WITH GFR
AG Ratio: 1.9 (calc) (ref 1.0–2.5)
ALT: 20 U/L (ref 6–29)
AST: 18 U/L (ref 10–35)
Albumin: 4.1 g/dL (ref 3.6–5.1)
Alkaline phosphatase (APISO): 51 U/L (ref 37–153)
BUN: 18 mg/dL (ref 7–25)
CO2: 28 mmol/L (ref 20–32)
Calcium: 9.3 mg/dL (ref 8.6–10.4)
Chloride: 105 mmol/L (ref 98–110)
Creat: 0.81 mg/dL (ref 0.50–1.03)
Globulin: 2.2 g/dL (ref 1.9–3.7)
Glucose, Bld: 102 mg/dL — ABNORMAL HIGH (ref 65–99)
Potassium: 4.9 mmol/L (ref 3.5–5.3)
Sodium: 139 mmol/L (ref 135–146)
Total Bilirubin: 0.8 mg/dL (ref 0.2–1.2)
Total Protein: 6.3 g/dL (ref 6.1–8.1)
eGFR: 87 mL/min/{1.73_m2} (ref >=60)

## 2023-05-26 NOTE — Progress Notes (Signed)
CMP WNL RBC count, hgb, and hct remain chronically elevated but stable x3 years.  Unclear if this is related to being a factor V Leiden carrier--please clarify if she has seen hematology in the past?

## 2023-05-26 NOTE — Progress Notes (Signed)
Hgb and hct have trended down slightly so we will continue to monitor.  May consider hematology referral in the future if trending up

## 2023-06-14 ENCOUNTER — Ambulatory Visit (INDEPENDENT_AMBULATORY_CARE_PROVIDER_SITE_OTHER): Payer: 59 | Admitting: Physician Assistant

## 2023-06-14 ENCOUNTER — Encounter: Payer: Self-pay | Admitting: Physician Assistant

## 2023-06-14 VITALS — BP 118/78 | HR 78 | Temp 97.3°F | Ht 64.5 in | Wt 159.8 lb

## 2023-06-14 DIAGNOSIS — N39498 Other specified urinary incontinence: Secondary | ICD-10-CM

## 2023-06-14 DIAGNOSIS — L4050 Arthropathic psoriasis, unspecified: Secondary | ICD-10-CM

## 2023-06-14 MED ORDER — MIRABEGRON ER 25 MG PO TB24
25.0000 mg | ORAL_TABLET | Freq: Every day | ORAL | 2 refills | Status: DC
Start: 2023-06-14 — End: 2023-07-05

## 2023-06-14 NOTE — Progress Notes (Signed)
Subjective:  Patient ID: Kaitlyn Hardy, female    DOB: 1971/01/13  Age: 52 y.o. MRN: [id number]  Chief Complaint  Patient presents with   Medication follow up    HPI Pt with psoriatic arthritis - she follows with rheumatology every 3-5 months She is currently on monthly Taltz injections which seems to be working well for her  Pt with urinary incontinence.  At one time she was on myrbetriq which worked really well for her but insurance did not cover - she would like to try to get it again Currently she is on vesicare 10mg  qd which works ok but not anywhere as well as myrbetriq     08/26/2022    3:11 PM  Depression screen PHQ 2/9  Decreased Interest 0  Down, Depressed, Hopeless 0  PHQ - 2 Score 0        08/28/2019    9:44 AM 08/26/2022    3:11 PM  Fall Risk  Falls in the past year?  0  Was there an injury with Fall?  0  Fall Risk Category Calculator  0  (RETIRED) Patient Fall Risk Level Low fall risk   Patient at Risk for Falls Due to  No Fall Risks  Fall risk Follow up  Education provided     ROS CONSTITUTIONAL: Negative for chills, fatigue, fever, unintentional weight gain and unintentional weight loss.  E/N/T: Negative for ear pain, nasal congestion and sore throat.  CARDIOVASCULAR: Negative for chest pain, dizziness, palpitations and pedal edema.  RESPIRATORY: Negative for recent cough and dyspnea.  GASTROINTESTINAL: Negative for abdominal pain, acid reflux symptoms, constipation, diarrhea, nausea and vomiting. GU - see HPI  MSK: see HPI INTEGUMENTARY: see HPI    Current Outpatient Medications:    calcipotriene (DOVONOX) 0.005 % ointment, Apply topically 2 (two) times daily., Disp: 60 g, Rfl: 0   cholecalciferol (VITAMIN D) 1000 UNITS tablet, Take 2,000 Units by mouth every other day., Disp: , Rfl:    cyclobenzaprine (FLEXERIL) 5 MG tablet, Take 1 tablet (5 mg total) by mouth 3 (three) times daily as needed for muscle spasms., Disp: 30 tablet, Rfl: 1    estradiol (CLIMARA - DOSED IN MG/24 HR) 0.05 mg/24hr patch, Place 0.05 mg onto the skin once a week., Disp: , Rfl:    Fexofenadine-Pseudoephedrine (ALLEGRA-D PO), Take by mouth daily., Disp: , Rfl:    ibuprofen (ADVIL) 800 MG tablet, Take 1 tablet (800 mg total) by mouth every 8 (eight) hours as needed., Disp: 90 tablet, Rfl: 1   magic mouthwash (nystatin, hydrocortisone, diphenhydrAMINE) suspension, Swish and spit 5 mLs 3 (three) times daily., Disp: 540 mL, Rfl: 0   MAGNESIUM PO, Take by mouth at bedtime., Disp: , Rfl:    mirabegron ER (MYRBETRIQ) 25 MG TB24 tablet, Take 1 tablet (25 mg total) by mouth daily., Disp: 30 tablet, Rfl: 2   Multiple Vitamin (MULTIVITAMIN) capsule, Take 1 capsule by mouth daily., Disp: , Rfl:    Probiotic Product (PROBIOTIC PO), Take by mouth daily., Disp: , Rfl:    TALTZ 80 MG/ML SOAJ, INJECT 1 PEN UNDER THE SKIN EVERY 4 WEEKS, Disp: 3 mL, Rfl: 0   Tapinarof (VTAMA) 1 % CREA, Apply topically., Disp: , Rfl:   Past Medical History:  Diagnosis Date   Allergy    Arthritis    psoriasis    Encounter for annual physical exam 08/26/2022   Factor V Leiden [facility])    recent  dx - single, no problems   OAB (overactive  bladder)    SVD (spontaneous vaginal delivery)    x 2   Vitamin D deficiency    Objective:  PHYSICAL EXAM:   BP 118/78 (BP Location: Left Arm, Patient Position: Sitting)   Pulse 78   Temp (!) 97.3 F (36.3 C) (Temporal)   Ht 5' 4.5" (1.638 m)   Wt 159 lb 12.8 oz (72.5 kg)   LMP 05/07/2013 (LMP Unknown)   SpO2 98%   BMI 27.01 kg/m    GEN: Well nourished, well developed, in no acute distress   Cardiac: RRR; no murmurs, rubs, or gallops,no edema - Respiratory:  normal respiratory rate and pattern with no distress - normal breath sounds with no rales, rhonchi, wheezes or rubs  Skin: warm and dry, no rash   Psych: euthymic mood, appropriate affect and demeanor  Assessment & Plan:    Other urinary incontinence -     Mirabegron ER; Take 1  tablet (25 mg total) by mouth daily.  Dispense: 30 tablet; Refill: 2 - stop vesicare  Psoriatic arthritis (HCC) Follow up with rheumatologist as directed    Follow-up: Return for fasting physical - 20 min - in February.  An After Visit Summary was printed and given to the patient.  Jettie Pagan [facility] [phone]

## 2023-06-17 ENCOUNTER — Telehealth: Payer: Self-pay

## 2023-06-17 NOTE — Telephone Encounter (Signed)
I already sent a note back regarding this -- she has tried other meds and needs to be sent back in for approval

## 2023-06-17 NOTE — Telephone Encounter (Signed)
PA sent for myrbetriq 25 mg, not cover insurance will cover these alternative Oxybutynin Chloride, Oxybutynin Chloride ER, Tolterodine Tartarate  ER, trospium Chloride, and  trospium Chloride ER. Please advise

## 2023-06-21 ENCOUNTER — Other Ambulatory Visit: Payer: Self-pay | Admitting: Physician Assistant

## 2023-06-21 DIAGNOSIS — L409 Psoriasis, unspecified: Secondary | ICD-10-CM

## 2023-06-21 DIAGNOSIS — Z79899 Other long term (current) drug therapy: Secondary | ICD-10-CM

## 2023-06-21 DIAGNOSIS — L4050 Arthropathic psoriasis, unspecified: Secondary | ICD-10-CM

## 2023-06-21 NOTE — Telephone Encounter (Signed)
Will resent PA

## 2023-06-22 NOTE — Telephone Encounter (Signed)
Last Fill: 03/31/2023  Labs: 05/25/2023 CMP WNL RBC count, hgb, and hct remain chronically elevated but stable  TB Gold: 11/18/2022 Neg    Next Visit: 10/27/2023  Last Visit: 05/25/2023  DX: Psoriatic arthritis   Current Dose per office note 05/25/2023: Taltz 80 mg sq inj q28 days   Okay to refill Taltz?

## 2023-07-01 ENCOUNTER — Encounter: Payer: Self-pay | Admitting: Physician Assistant

## 2023-07-02 ENCOUNTER — Encounter: Payer: Self-pay | Admitting: Physician Assistant

## 2023-07-05 ENCOUNTER — Ambulatory Visit: Payer: 59

## 2023-07-05 ENCOUNTER — Other Ambulatory Visit: Payer: Self-pay | Admitting: Physician Assistant

## 2023-07-05 VITALS — BP 118/70 | HR 68 | Temp 97.9°F | Resp 16 | Ht 64.5 in | Wt 159.8 lb

## 2023-07-05 DIAGNOSIS — N3941 Urge incontinence: Secondary | ICD-10-CM | POA: Diagnosis not present

## 2023-07-05 DIAGNOSIS — H6123 Impacted cerumen, bilateral: Secondary | ICD-10-CM | POA: Diagnosis not present

## 2023-07-05 MED ORDER — SOLIFENACIN SUCCINATE 10 MG PO TABS: 10.0000 mg | ORAL_TABLET | Freq: Every day | ORAL | 2 refills | Status: DC

## 2023-07-05 NOTE — Assessment & Plan Note (Signed)
Refilled Vesicare 10 mg daily while waiting for Myrbetriq prior authorization

## 2023-07-05 NOTE — Assessment & Plan Note (Signed)
Ear irrigation done for bilateral impacted soft cerumen. Patient felt better after the irrigation. No signs of infection.  Plan: advised to apply debrox couple times a month to help clear up cerumen Recommended against using qtips

## 2023-07-05 NOTE — Progress Notes (Signed)
Acute Office Visit  Subjective:    Patient ID: Kaitlyn Hardy, female    DOB: Jan 11, 1971, 52 y.o.   MRN: [id number]  Chief Complaint  Patient presents with   Ear Fullness       HPI: Patient is in today for a problem visit. States she feels her ears are clogged. Denies pain. Has applied debrox last night. Has had relief from ear flushing in the past.  Also, was on myrbetriq for urge incontinence. Is running out of pills and insurance has not filled it. Prior Berkley Harvey is in process. She is hoping to at least go back on vesicare while waiting for Myrbetriq PA.   Past Medical History:  Diagnosis Date   Allergy    Arthritis    psoriasis    Encounter for annual physical exam 08/26/2022   Factor V Leiden (HCC)    recent  dx - single, no problems   OAB (overactive bladder)    SVD (spontaneous vaginal delivery)    x 2   Vitamin D deficiency     Past Surgical History:  Procedure Laterality Date   ABDOMINAL HYSTERECTOMY  partial   2014   ABLATION     done in MD's office    AUGMENTATION MAMMAPLASTY Bilateral 02/25/1999   saline   BREAST SURGERY  02/1999   augmentation    LAPAROSCOPIC ASSISTED VAGINAL HYSTERECTOMY N/A 06/26/2013   Procedure: LAPAROSCOPIC ASSISTED VAGINAL HYSTERECTOMY;  Surgeon: Zelphia Cairo, MD;  Location: [facility];  Service: Gynecology;  Laterality: N/A;   WISDOM TOOTH EXTRACTION      Family History  Problem Relation Age of Onset   Pancreatic cancer Mother    Diabetes Mother    Thyroid disease Mother    Fibromyalgia Mother    Cancer Mother    Prostate cancer Father    Cancer Father    Healthy Son    Colon cancer Neg Hx    Rectal cancer Neg Hx    Stomach cancer Neg Hx     Social History   Socioeconomic History   Marital status: Married    Spouse name: Not on file   Number of children: Not on file   Years of education: Not on file   Highest education level: Associate degree: academic program  Occupational History   Not on file   Tobacco Use   Smoking status: Never    Passive exposure: Never   Smokeless tobacco: Never  Vaping Use   Vaping status: Never Used  Substance and Sexual Activity   Alcohol use: No   Drug use: No   Sexual activity: Yes    Birth control/protection: Surgical    Comment: Husband vasectomy  Other Topics Concern   Not on file  Social History Narrative   Not on file   Social Determinants of Health   Financial Resource Strain: Low Risk  (08/19/2022)   Overall Financial Resource Strain (CARDIA)    Difficulty of Paying Living Expenses: Not hard at all  Food Insecurity: No Food Insecurity (08/19/2022)   Hunger Vital Sign    Worried About Running Out of Food in the Last Year: Never true    Ran Out of Food in the Last Year: Never true  Transportation Needs: No Transportation Needs (08/19/2022)   PRAPARE - Administrator, Civil Service (Medical): No    Lack of Transportation (Non-Medical): No  Physical Activity: Sufficiently Active (08/19/2022)   Exercise Vital Sign    Days of Exercise per Week: 4 days  Minutes of Exercise per Session: 50 min  Stress: No Stress Concern Present (08/19/2022)   [employer] of Occupational Health - Occupational Stress Questionnaire    Feeling of Stress : Only a little  Social Connections: Moderately Integrated (08/19/2022)   Social Connection and Isolation Panel [NHANES]    Frequency of Communication with Friends and Family: Once a week    Frequency of Social Gatherings with Friends and Family: Once a week    Attends Religious Services: More than 4 times per year    Active Member of [employer] or Organizations: Yes    Attends Engineer, structural: More than 4 times per year    Marital Status: Married  Catering manager Violence: Not on file    Outpatient Medications Prior to Visit  Medication Sig Dispense Refill   calcipotriene (DOVONOX) 0.005 % ointment Apply topically 2 (two) times daily. 60 g 0   cholecalciferol (VITAMIN D) 1000  UNITS tablet Take 2,000 Units by mouth every other day.     cyclobenzaprine (FLEXERIL) 5 MG tablet Take 1 tablet (5 mg total) by mouth 3 (three) times daily as needed for muscle spasms. 30 tablet 1   estradiol (CLIMARA - DOSED IN MG/24 HR) 0.05 mg/24hr patch Place 0.05 mg onto the skin once a week.     Fexofenadine-Pseudoephedrine (ALLEGRA-D PO) Take by mouth daily.     ibuprofen (ADVIL) 800 MG tablet Take 1 tablet (800 mg total) by mouth every 8 (eight) hours as needed. 90 tablet 1   magic mouthwash (nystatin, hydrocortisone, diphenhydrAMINE) suspension Swish and spit 5 mLs 3 (three) times daily. 540 mL 0   MAGNESIUM PO Take by mouth at bedtime.     Multiple Vitamin (MULTIVITAMIN) capsule Take 1 capsule by mouth daily.     Probiotic Product (PROBIOTIC PO) Take by mouth daily.     TALTZ 80 MG/ML pen INJECT 1 PEN UNDER THE SKIN EVERY 4 WEEKS 3 mL 0   Tapinarof (VTAMA) 1 % CREA Apply topically.     mirabegron ER (MYRBETRIQ) 25 MG TB24 tablet Take 1 tablet (25 mg total) by mouth daily. 30 tablet 2   No facility-administered medications prior to visit.    No Known Allergies  Review of Systems  Constitutional: Negative.   HENT:         Ear fullness bilateral   Respiratory: Negative.    Cardiovascular: Negative.   Genitourinary:  Positive for frequency and urgency.  All other systems reviewed and are negative.      Objective:        07/05/2023    9:19 AM 06/14/2023    9:54 AM 05/25/2023    8:13 AM  Vitals with BMI  Height 5' 4.5" 5' 4.5" 5' 4.5"  Weight 159 lbs 13 oz 159 lbs 13 oz 161 lbs  BMI 27.02 27.02 27.22  Systolic 118 118 161  Diastolic 70 78 80  Pulse 68 78 69    Orthostatic VS for the past 72 hrs (Last 3 readings):  Patient Position BP Location Cuff Size  07/05/23 0919 Sitting Left Arm Normal     Physical Exam Vitals and nursing note reviewed.  Constitutional:      Appearance: Normal appearance.  HENT:     Head: Normocephalic and atraumatic.     Ears:      Comments: Some cerumen noted in both ear canals. Looks soft. Could not visualize Tympanic membranes bilaterally.  After irrigation bilaterally, visualized TM and noted some scarring bilaterally due to prior infection.  No active ear infection at this time Cardiovascular:     Rate and Rhythm: Normal rate and regular rhythm.  Pulmonary:     Effort: Pulmonary effort is normal.     Breath sounds: Normal breath sounds.  Musculoskeletal:     Cervical back: Normal range of motion.  Neurological:     Mental Status: She is alert.     Health Maintenance Due  Topic Date Due   COVID-19 Vaccine (1) Never done   Cervical Cancer Screening (HPV/Pap Cotest)  Never done   MAMMOGRAM  10/06/2019    There are no preventive care reminders to display for this patient.   Lab Results  Component Value Date   TSH 1.220 08/26/2022   Lab Results  Component Value Date   WBC 6.2 05/25/2023   HGB 16.1 (H) 05/25/2023   HCT 49.2 (H) 05/25/2023   MCV 88.5 05/25/2023   PLT 194 05/25/2023   Lab Results  Component Value Date   NA 139 05/25/2023   K 4.9 05/25/2023   CO2 28 05/25/2023   GLUCOSE 102 (H) 05/25/2023   BUN 18 05/25/2023   CREATININE 0.81 05/25/2023   BILITOT 0.8 05/25/2023   ALKPHOS 62 11/18/2022   AST 18 05/25/2023   ALT 20 05/25/2023   PROT 6.3 05/25/2023   ALBUMIN 4.2 11/18/2022   CALCIUM 9.3 05/25/2023   ANIONGAP 6 08/28/2019   EGFR 87 05/25/2023   No results found for: "CHOL" No results found for: "HDL" No results found for: "LDLCALC" No results found for: "TRIG" No results found for: "CHOLHDL" Lab Results  Component Value Date   HGBA1C 5.6 08/26/2022       Assessment & Plan:  Bilateral impacted cerumen Assessment & Plan: Ear irrigation done for bilateral impacted soft cerumen. Patient felt better after the irrigation. No signs of infection.  Plan: advised to apply debrox couple times a month to help clear up cerumen Recommended against using qtips   Urinary  incontinence, urge Assessment & Plan: Refilled Vesicare 10 mg daily while waiting for Myrbetriq prior authorization   Other orders -     Solifenacin Succinate; Take 1 tablet (10 mg total) by mouth daily.  Dispense: 30 tablet; Refill: 2     Meds ordered this encounter  Medications   solifenacin (VESICARE) 10 MG tablet    Sig: Take 1 tablet (10 mg total) by mouth daily.    Dispense:  30 tablet    Refill:  2    No orders of the defined types were placed in this encounter.    Follow-up: Return if symptoms worsen or fail to improve.  An After Visit Summary was printed and given to the patient. I attest that I have reviewed this visit and agree with the plan scribed by my staff.   Windell Moment, MD [facility] [phone]    Windell Moment, MD [facility] [phone]

## 2023-07-05 NOTE — Patient Instructions (Signed)
Refilled vesicare Please apply debrox every 2-3 weeks Return if problems

## 2023-07-12 ENCOUNTER — Ambulatory Visit: Payer: 59

## 2023-07-12 VITALS — BP 110/70 | HR 82 | Temp 98.2°F | Resp 16 | Ht 64.5 in | Wt 162.4 lb

## 2023-07-12 DIAGNOSIS — H60311 Diffuse otitis externa, right ear: Secondary | ICD-10-CM | POA: Diagnosis not present

## 2023-07-12 DIAGNOSIS — H9201 Otalgia, right ear: Secondary | ICD-10-CM | POA: Insufficient documentation

## 2023-07-12 MED ORDER — CIPROFLOXACIN-DEXAMETHASONE 0.3-0.1 % OT SUSP: 4.0000 [drp] | Freq: Two times a day (BID) | OTIC | 0 refills | Status: AC

## 2023-07-12 NOTE — Assessment & Plan Note (Signed)
Infection of the right ear canal and pinna, presenting with pain, tenderness, fluid sensation, popping, and itching. The ear canal appears scaly and tender, and the pinna is swollen. No signs of middle ear infection. Likely exacerbated by recent ear lavage and immunosuppression from Taltz for psoriatic arthritis. Ear drops preferred over oral antibiotics for outer ear infections. Improvement expected within 2 days; call if symptoms worsen or if there is increased swelling or pain. - Prescribe Ciprodex ear drops, 4 drops in the right ear, twice daily for 7 days - Ensure coverage of the ear canal surface with drops - Monitor for improvement within 2 days - Call if symptoms worsen or if there is increased swelling or pain; consider oral antibiotics  Psoriatic Arthritis Currently on Taltz, which may contribute to increased susceptibility to infections. - Continue Taltz  Follow-up - Check status of prior authorization for Myrbetriq.

## 2023-07-12 NOTE — Assessment & Plan Note (Signed)
Possibly due to otitis externa of the right ear

## 2023-07-12 NOTE — Patient Instructions (Signed)
VISIT SUMMARY:  During today's visit, we addressed your right ear discomfort that started after your recent ear lavage. You described symptoms such as fluid sensation, popping sounds, itching, and significant tenderness. We also discussed your history of unusual infections since starting Taltz for psoriatic arthritis and noted a few psoriatic spots on your body.  YOUR PLAN:  -OTITIS EXTERNA: Otitis Externa is an infection of the outer ear canal and pinna, causing pain, tenderness, fluid sensation, popping, and itching. This condition was likely worsened by your recent ear lavage and the immunosuppressive effects of Taltz. We have prescribed Ciprodex ear drops, 4 drops in the right ear, twice daily for 7 days. Ensure the drops cover the ear canal surface. You should see improvement within 2 days. Please call us if your symptoms worsen or if there is increased swelling or pain.  -PSORIATIC ARTHRITIS: Psoriatic arthritis is a type of arthritis that affects some people with psoriasis, causing joint pain and stiffness. You are currently on Taltz, which can increase your susceptibility to infections. Continue taking Taltz as prescribed.  INSTRUCTIONS:  Please monitor your ear for improvement within 2 days. If your symptoms worsen or if there is increased swelling or pain, contact us immediately. We are also checking the status of your prior authorization for Myrbetriq.

## 2023-07-12 NOTE — Progress Notes (Signed)
Acute Office Visit  Subjective:    Patient ID: Kaitlyn Hardy, female    DOB: 1971/03/17, 52 y.o.   MRN: [id number]  Chief Complaint  Patient presents with   Ear Pain   Patient presents with ar pain and itching since Thursday. Discussed the use of AI scribe software for clinical note transcription with the patient, who gave verbal consent to proceed.      HPI: Patient is in today.  The patient, with a history of psoriatic arthritis and a recent ear lavage, presents with right ear discomfort that began several days after the procedure. She describes a sensation of fluid in the ear, associated with popping sounds, itching, and significant tenderness in the area. The discomfort is severe enough to require Advil for relief. The patient denies similar symptoms in the left ear.  In addition, the patient mentions a history of unusual infections since her diagnosis of psoriatic arthritis and initiation of Taltz, a medication known to lower the immune system. She also reports a few psoriatic spots on her body.  She inquires about the PA of her myrbetriq  Past Medical History:  Diagnosis Date   Allergy    Arthritis    psoriasis    Encounter for annual physical exam 08/26/2022   Factor V Leiden (HCC)    recent  dx - single, no problems   OAB (overactive bladder)    SVD (spontaneous vaginal delivery)    x 2   Vitamin D deficiency     Past Surgical History:  Procedure Laterality Date   ABDOMINAL HYSTERECTOMY  partial   2014   ABLATION     done in MD's office    AUGMENTATION MAMMAPLASTY Bilateral 02/25/1999   saline   BREAST SURGERY  02/1999   augmentation    LAPAROSCOPIC ASSISTED VAGINAL HYSTERECTOMY N/A 06/26/2013   Procedure: LAPAROSCOPIC ASSISTED VAGINAL HYSTERECTOMY;  Surgeon: Zelphia Cairo, MD;  Location: [facility];  Service: Gynecology;  Laterality: N/A;   WISDOM TOOTH EXTRACTION      Family History  Problem Relation Age of Onset   Pancreatic cancer Mother     Diabetes Mother    Thyroid disease Mother    Fibromyalgia Mother    Cancer Mother    Prostate cancer Father    Cancer Father    Healthy Son    Colon cancer Neg Hx    Rectal cancer Neg Hx    Stomach cancer Neg Hx     Social History   Socioeconomic History   Marital status: Married    Spouse name: Not on file   Number of children: Not on file   Years of education: Not on file   Highest education level: Associate degree: academic program  Occupational History   Not on file  Tobacco Use   Smoking status: Never    Passive exposure: Never   Smokeless tobacco: Never  Vaping Use   Vaping status: Never Used  Substance and Sexual Activity   Alcohol use: No   Drug use: No   Sexual activity: Yes    Birth control/protection: Surgical    Comment: Husband vasectomy  Other Topics Concern   Not on file  Social History Narrative   Not on file   Social Drivers of Health   Financial Resource Strain: Low Risk  (08/19/2022)   Overall Financial Resource Strain (CARDIA)    Difficulty of Paying Living Expenses: Not hard at all  Food Insecurity: No Food Insecurity (08/19/2022)   Hunger Vital Sign  Worried About Programme researcher, broadcasting/film/video in the Last Year: Never true    Ran Out of Food in the Last Year: Never true  Transportation Needs: No Transportation Needs (08/19/2022)   PRAPARE - Administrator, Civil Service (Medical): No    Lack of Transportation (Non-Medical): No  Physical Activity: Sufficiently Active (08/19/2022)   Exercise Vital Sign    Days of Exercise per Week: 4 days    Minutes of Exercise per Session: 50 min  Stress: No Stress Concern Present (08/19/2022)   [employer] of Occupational Health - Occupational Stress Questionnaire    Feeling of Stress : Only a little  Social Connections: Moderately Integrated (08/19/2022)   Social Connection and Isolation Panel [NHANES]    Frequency of Communication with Friends and Family: Once a week    Frequency of Social  Gatherings with Friends and Family: Once a week    Attends Religious Services: More than 4 times per year    Active Member of [employer] or Organizations: Yes    Attends Engineer, structural: More than 4 times per year    Marital Status: Married  Catering manager Violence: Not on file    Outpatient Medications Prior to Visit  Medication Sig Dispense Refill   calcipotriene (DOVONOX) 0.005 % ointment Apply topically 2 (two) times daily. 60 g 0   cholecalciferol (VITAMIN D) 1000 UNITS tablet Take 2,000 Units by mouth every other day.     cyclobenzaprine (FLEXERIL) 5 MG tablet Take 1 tablet (5 mg total) by mouth 3 (three) times daily as needed for muscle spasms. 30 tablet 1   estradiol (CLIMARA - DOSED IN MG/24 HR) 0.05 mg/24hr patch Place 0.05 mg onto the skin once a week.     Fexofenadine-Pseudoephedrine (ALLEGRA-D PO) Take by mouth daily.     ibuprofen (ADVIL) 800 MG tablet Take 1 tablet (800 mg total) by mouth every 8 (eight) hours as needed. 90 tablet 1   magic mouthwash (nystatin, hydrocortisone, diphenhydrAMINE) suspension Swish and spit 5 mLs 3 (three) times daily. 540 mL 0   MAGNESIUM PO Take by mouth at bedtime.     Multiple Vitamin (MULTIVITAMIN) capsule Take 1 capsule by mouth daily.     Probiotic Product (PROBIOTIC PO) Take by mouth daily.     solifenacin (VESICARE) 10 MG tablet Take 1 tablet (10 mg total) by mouth daily. 30 tablet 2   TALTZ 80 MG/ML pen INJECT 1 PEN UNDER THE SKIN EVERY 4 WEEKS 3 mL 0   Tapinarof (VTAMA) 1 % CREA Apply topically.     No facility-administered medications prior to visit.    No Known Allergies  Review of Systems  Constitutional: Negative.   HENT:  Positive for ear pain.   Eyes: Negative.   Respiratory: Negative.    Cardiovascular: Negative.   Endocrine: Negative for polyuria.  Neurological: Negative.   Psychiatric/Behavioral: Negative.         Objective:        07/12/2023    4:00 PM 07/05/2023    9:19 AM 06/14/2023    9:54 AM   Vitals with BMI  Height 5' 4.5" 5' 4.5" 5' 4.5"  Weight 162 lbs 6 oz 159 lbs 13 oz 159 lbs 13 oz  BMI 27.46 27.02 27.02  Systolic 110 118 161  Diastolic 70 70 78  Pulse 82 68 78    Orthostatic VS for the past 72 hrs (Last 3 readings):  Patient Position BP Location Cuff Size  07/12/23 1600 Sitting  Right Arm Large     Physical Exam HENT:     Head: Normocephalic and atraumatic.     Comments: HEENT: Right ear canal and pinna swollen and tender, ear canal appears scaly. No middle ear infection.    Nose: Nose normal.  Cardiovascular:     Rate and Rhythm: Normal rate and regular rhythm.  Pulmonary:     Effort: Pulmonary effort is normal.     Breath sounds: Normal breath sounds.  Musculoskeletal:        General: Normal range of motion.     Cervical back: Normal range of motion.  Neurological:     General: No focal deficit present.  Psychiatric:        Mood and Affect: Mood normal.     Health Maintenance Due  Topic Date Due   COVID-19 Vaccine (1) Never done   Cervical Cancer Screening (HPV/Pap Cotest)  Never done   MAMMOGRAM  10/06/2019    There are no preventive care reminders to display for this patient.   Lab Results  Component Value Date   TSH 1.220 08/26/2022   Lab Results  Component Value Date   WBC 6.2 05/25/2023   HGB 16.1 (H) 05/25/2023   HCT 49.2 (H) 05/25/2023   MCV 88.5 05/25/2023   PLT 194 05/25/2023   Lab Results  Component Value Date   NA 139 05/25/2023   K 4.9 05/25/2023   CO2 28 05/25/2023   GLUCOSE 102 (H) 05/25/2023   BUN 18 05/25/2023   CREATININE 0.81 05/25/2023   BILITOT 0.8 05/25/2023   ALKPHOS 62 11/18/2022   AST 18 05/25/2023   ALT 20 05/25/2023   PROT 6.3 05/25/2023   ALBUMIN 4.2 11/18/2022   CALCIUM 9.3 05/25/2023   ANIONGAP 6 08/28/2019   EGFR 87 05/25/2023   No results found for: "CHOL" No results found for: "HDL" No results found for: "LDLCALC" No results found for: "TRIG" No results found for: "CHOLHDL" Lab Results   Component Value Date   HGBA1C 5.6 08/26/2022       Assessment & Plan:  Right ear pain Assessment & Plan: Possibly due to otitis externa of the right ear   Acute diffuse otitis externa of right ear Assessment & Plan: Infection of the right ear canal and pinna, presenting with pain, tenderness, fluid sensation, popping, and itching. The ear canal appears scaly and tender, and the pinna is swollen. No signs of middle ear infection. Likely exacerbated by recent ear lavage and immunosuppression from Taltz for psoriatic arthritis. Ear drops preferred over oral antibiotics for outer ear infections. Improvement expected within 2 days; call if symptoms worsen or if there is increased swelling or pain. - Prescribe Ciprodex ear drops, 4 drops in the right ear, twice daily for 7 days - Ensure coverage of the ear canal surface with drops - Monitor for improvement within 2 days - Call if symptoms worsen or if there is increased swelling or pain; consider oral antibiotics  Psoriatic Arthritis Currently on Taltz, which may contribute to increased susceptibility to infections. - Continue Taltz  Follow-up - Check status of prior authorization for Myrbetriq.   Other orders -     Ciprofloxacin-dexAMETHasone; Place 4 drops into the right ear 2 (two) times daily for 7 days.  Dispense: 7.5 mL; Refill: 0     Meds ordered this encounter  Medications   ciprofloxacin-dexamethasone (CIPRODEX) OTIC suspension    Sig: Place 4 drops into the right ear 2 (two) times daily for 7 days.  Dispense:  7.5 mL    Refill:  0    No orders of the defined types were placed in this encounter.    Follow-up: Return if symptoms worsen or fail to improve.  An After Visit Summary was printed and given to the patient.  Windell Moment, MD [facility] [phone]

## 2023-08-02 ENCOUNTER — Encounter: Payer: Self-pay | Admitting: Physician Assistant

## 2023-08-04 ENCOUNTER — Encounter: Payer: Self-pay | Admitting: Family Medicine

## 2023-08-04 ENCOUNTER — Ambulatory Visit: Payer: 59 | Admitting: Family Medicine

## 2023-08-04 VITALS — BP 100/64 | HR 71 | Temp 98.6°F | Resp 16 | Ht 64.5 in | Wt 162.0 lb

## 2023-08-04 DIAGNOSIS — J0100 Acute maxillary sinusitis, unspecified: Secondary | ICD-10-CM | POA: Diagnosis not present

## 2023-08-04 DIAGNOSIS — H66013 Acute suppurative otitis media with spontaneous rupture of ear drum, bilateral: Secondary | ICD-10-CM | POA: Diagnosis not present

## 2023-08-04 DIAGNOSIS — J45909 Unspecified asthma, uncomplicated: Secondary | ICD-10-CM | POA: Insufficient documentation

## 2023-08-04 DIAGNOSIS — R051 Acute cough: Secondary | ICD-10-CM | POA: Insufficient documentation

## 2023-08-04 DIAGNOSIS — J4520 Mild intermittent asthma, uncomplicated: Secondary | ICD-10-CM | POA: Diagnosis not present

## 2023-08-04 DIAGNOSIS — J029 Acute pharyngitis, unspecified: Secondary | ICD-10-CM

## 2023-08-04 HISTORY — DX: Unspecified asthma, uncomplicated: J45.909

## 2023-08-04 LAB — POCT RAPID STREP A DIPSTICK TEST: Rapid Strep A Screen: NEGATIVE

## 2023-08-04 MED ORDER — AMOXICILLIN-POT CLAVULANATE 875-125 MG PO TABS: 1.0000 | ORAL_TABLET | Freq: Two times a day (BID) | ORAL | 0 refills | Status: AC

## 2023-08-04 MED ORDER — GUAIFENESIN-CODEINE 100-10 MG/5ML PO SOLN: 5.0000 mL | Freq: Three times a day (TID) | ORAL | 0 refills | Status: DC | PRN

## 2023-08-04 MED ORDER — ALBUTEROL SULFATE HFA 108 (90 BASE) MCG/ACT IN AERS: 2.0000 | INHALATION_SPRAY | Freq: Four times a day (QID) | RESPIRATORY_TRACT | 2 refills | Status: AC | PRN

## 2023-08-04 NOTE — Assessment & Plan Note (Signed)
-  Prescribe cough medicine for nighttime use to aid sleep.

## 2023-08-04 NOTE — Assessment & Plan Note (Addendum)
-  Prescribe Albuterol inhaler for reactive airway and coughing fits. Use as needed, rinse mouth after use. 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

## 2023-08-04 NOTE — Progress Notes (Signed)
 Acute Office Visit  Subjective:    Patient ID: Kaitlyn Hardy, female    DOB: [birth year], 53 y.o.   MRN: [id number]  Chief Complaint  Patient presents with   Cough   Nasal Congestion   Ear Fullness    Discussed the use of AI scribe software for clinical note transcription with the patient, who gave verbal consent to proceed.   HPI: The patient, with a history of arthritis and currently on Taltz , an immunosuppressant, presents with persistent cough and sinus pressure. They describe the cough as severe, leading to fits where they feel hot and cannot stop coughing. The coughing fits occur variably throughout the day and have been interfering with their daily activities and exercise routine. They also report a sensation of fluid draining in their ears and occasional sore throat. They have been managing these symptoms with Mucinex , Advil , and Allegra. 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  Past Medical History:  Diagnosis Date   Allergy    Arthritis    psoriasis    Encounter for annual physical exam 08/26/2022   Factor V Leiden [facility])    recent  dx - single, no problems   OAB (overactive bladder)    Reactive airway disease 08/04/2023   SVD (spontaneous vaginal delivery)    x 2   Vitamin D deficiency     Past Surgical History:  Procedure Laterality Date   ABDOMINAL HYSTERECTOMY  partial   2014   ABLATION     done in MD's office    AUGMENTATION MAMMAPLASTY Bilateral 02/25/1999   saline   BREAST SURGERY  02/1999   augmentation    LAPAROSCOPIC ASSISTED VAGINAL HYSTERECTOMY N/A 06/26/2013   Procedure: LAPAROSCOPIC ASSISTED VAGINAL HYSTERECTOMY;  Surgeon: Truman Corona, MD;  Location: [facility];  Service: Gynecology;  Laterality: N/A;   WISDOM TOOTH EXTRACTION      Family History  Problem Relation Age of Onset    Pancreatic cancer Mother    Diabetes Mother    Thyroid disease Mother    Fibromyalgia Mother    Cancer Mother    Prostate cancer Father    Cancer Father    Healthy Son    Colon cancer Neg Hx    Rectal cancer Neg Hx    Stomach cancer Neg Hx     Social History   Socioeconomic History   Marital status: Married    Spouse name: Not on file   Number of children: Not on file   Years of education: Not on file   Highest education level: Associate degree: academic program  Occupational History   Not on file  Tobacco Use   Smoking status: Never    Passive exposure: Never   Smokeless tobacco: Never  Vaping Use   Vaping status: Never Used  Substance and Sexual Activity   Alcohol use: No   Drug use: No   Sexual activity: Yes    Birth control/protection: Surgical    Comment: Husband vasectomy  Other Topics Concern   Not on file  Social History Narrative   Not on file   Social Drivers of Health   Financial Resource Strain: Low Risk  (08/19/2022)   Overall Financial Resource Strain (CARDIA)    Difficulty of Paying Living Expenses: Not hard at all  Food Insecurity: No Food Insecurity (08/19/2022)  Hunger Vital Sign    Worried About Running Out of Food in the Last Year: Never true    Ran Out of Food in the Last Year: Never true  Transportation Needs: No Transportation Needs (08/19/2022)   PRAPARE - Administrator, Civil Service (Medical): No    Lack of Transportation (Non-Medical): No  Physical Activity: Sufficiently Active (08/19/2022)   Exercise Vital Sign    Days of Exercise per Week: 4 days    Minutes of Exercise per Session: 50 min  Stress: No Stress Concern Present (08/19/2022)   [employer] of Occupational Health - Occupational Stress Questionnaire    Feeling of Stress : Only a little  Social Connections: Moderately Integrated (08/19/2022)   Social Connection and Isolation Panel [NHANES]    Frequency of Communication with Friends and Family: Once a  week    Frequency of Social Gatherings with Friends and Family: Once a week    Attends Religious Services: More than 4 times per year    Active Member of [employer] or Organizations: Yes    Attends Engineer, Structural: More than 4 times per year    Marital Status: Married  Catering Manager Violence: Not on file    Outpatient Medications Prior to Visit  Medication Sig Dispense Refill   calcipotriene  (DOVONOX) 0.005 % ointment Apply topically 2 (two) times daily. 60 g 0   cholecalciferol (VITAMIN D) 1000 UNITS tablet Take 2,000 Units by mouth every other day.     cyclobenzaprine  (FLEXERIL ) 5 MG tablet Take 1 tablet (5 mg total) by mouth 3 (three) times daily as needed for muscle spasms. 30 tablet 1   estradiol (CLIMARA - DOSED IN MG/24 HR) 0.05 mg/24hr patch Place 0.05 mg onto the skin once a week.     Fexofenadine-Pseudoephedrine (ALLEGRA-D PO) Take by mouth daily.     ibuprofen  (ADVIL ) 800 MG tablet Take 1 tablet (800 mg total) by mouth every 8 (eight) hours as needed. 90 tablet 1   magic mouthwash (nystatin , hydrocortisone , diphenhydrAMINE ) suspension Swish and spit 5 mLs 3 (three) times daily. 540 mL 0   MAGNESIUM PO Take by mouth at bedtime.     Multiple Vitamin (MULTIVITAMIN) capsule Take 1 capsule by mouth daily.     Probiotic Product (PROBIOTIC PO) Take by mouth daily.     solifenacin  (VESICARE ) 10 MG tablet Take 1 tablet (10 mg total) by mouth daily. 30 tablet 2   TALTZ  80 MG/ML pen INJECT 1 PEN UNDER THE SKIN EVERY 4 WEEKS 3 mL 0   Tapinarof (VTAMA) 1 % CREA Apply topically.     Testosterone 2 MG/24HR PT24 Apply 2 mg topically 2 (two) times a week.     No facility-administered medications prior to visit.    No Known Allergies  Review of Systems  Constitutional:  Negative for chills, diaphoresis, fatigue and fever.  HENT:  Positive for ear discharge, sinus pressure and sore throat. Negative for congestion, ear pain and sinus pain.   Respiratory:  Positive for cough.  Negative for shortness of breath.   Cardiovascular:  Negative for chest pain.  Gastrointestinal:  Negative for vomiting.  Musculoskeletal:  Positive for arthralgias.  Neurological:  Negative for weakness and headaches.  Psychiatric/Behavioral:  Negative for dysphoric mood. The patient is not nervous/anxious.        Objective:        08/04/2023    8:37 AM 07/12/2023    4:00 PM 07/05/2023    9:19 AM  Vitals with  BMI  Height 5' 4.5 5' 4.5 5' 4.5  Weight 162 lbs 162 lbs 6 oz 159 lbs 13 oz  BMI 27.39 27.46 27.02  Systolic 100 110 881  Diastolic 64 70 70  Pulse 71 82 68    Orthostatic VS for the past 72 hrs (Last 3 readings):  Patient Position BP Location Cuff Size  08/04/23 0837 Sitting Right Arm Normal     Physical Exam Vitals reviewed.  Constitutional:      General: She is not in acute distress.    Appearance: Normal appearance. She is ill-appearing.  HENT:     Head: Normocephalic.     Right Ear: Drainage present. Tympanic membrane is erythematous.     Left Ear: Drainage present. Tympanic membrane is erythematous.     Nose:     Right Sinus: Maxillary sinus tenderness and frontal sinus tenderness present.     Left Sinus: Maxillary sinus tenderness and frontal sinus tenderness present.     Mouth/Throat:     Pharynx: Posterior oropharyngeal erythema present. No pharyngeal swelling or oropharyngeal exudate.     Tonsils: No tonsillar exudate.  Eyes:     Conjunctiva/sclera: Conjunctivae normal.  Cardiovascular:     Rate and Rhythm: Normal rate and regular rhythm.     Heart sounds: Normal heart sounds. No murmur heard. Pulmonary:     Effort: Pulmonary effort is normal.     Breath sounds: Normal breath sounds. No wheezing.  Musculoskeletal:     Cervical back: Normal range of motion.  Skin:    General: Skin is warm.  Neurological:     Mental Status: She is alert. Mental status is at baseline.  Psychiatric:        Mood and Affect: Mood normal.        Behavior:  Behavior normal.     Health Maintenance Due  Topic Date Due   COVID-19 Vaccine (1) Never done   Cervical Cancer Screening (HPV/Pap Cotest)  Never done   MAMMOGRAM  10/06/2019    There are no preventive care reminders to display for this patient.   Lab Results  Component Value Date   TSH 1.220 08/26/2022   Lab Results  Component Value Date   WBC 6.2 05/25/2023   HGB 16.1 (H) 05/25/2023   HCT 49.2 (H) 05/25/2023   MCV 88.5 05/25/2023   PLT 194 05/25/2023   Lab Results  Component Value Date   NA 139 05/25/2023   K 4.9 05/25/2023   CO2 28 05/25/2023   GLUCOSE 102 (H) 05/25/2023   BUN 18 05/25/2023   CREATININE 0.81 05/25/2023   BILITOT 0.8 05/25/2023   ALKPHOS 62 11/18/2022   AST 18 05/25/2023   ALT 20 05/25/2023   PROT 6.3 05/25/2023   ALBUMIN 4.2 11/18/2022   CALCIUM 9.3 05/25/2023   ANIONGAP 6 08/28/2019   EGFR 87 05/25/2023   No results found for: CHOL No results found for: HDL No results found for: LDLCALC No results found for: TRIG No results found for: CHOLHDL Lab Results  Component Value Date   HGBA1C 5.6 08/26/2022       Assessment & Plan:  Non-recurrent acute suppurative otitis media of both ears with spontaneous rupture of tympanic membranes Assessment & Plan: Acute Augmentin  875 mg PO TWICE A DAY for 7 days Educated on possible side effects of medication and that she may try a probiotic for GI symptoms   Orders: -     Amoxicillin -Pot Clavulanate; Take 1 tablet by mouth 2 (two) times  daily for 7 days.  Dispense: 14 tablet; Refill: 0  Acute non-recurrent maxillary sinusitis Assessment & Plan: Persistent cough, sinus pressure, and ear drainage. No shortness of breath or chest pain. Patient is on Taltz , an immunosuppressant, which may prolong recovery. 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 -Prescribe Augmentin  for suspected sinus infection.   Mild intermittent reactive airway disease without  complication Assessment & Plan: -Prescribe Albuterol  inhaler for reactive airway and coughing fits. Use as needed, rinse mouth after use. 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  Orders: -     Albuterol  Sulfate HFA; Inhale 2 puffs into the lungs every 6 (six) hours as needed for wheezing or shortness of breath.  Dispense: 8 g; Refill: 2  Pharyngitis, unspecified etiology Assessment & Plan: Strep test - negative - Try honey and lemon for sore throat - Gargle with warm salt water - Try hot tea to soothe the throat    Acute cough Assessment & Plan: -Prescribe cough medicine for nighttime use to aid sleep.  Orders: -     guaiFENesin -Codeine ; Take 5 mLs by mouth 3 (three) times daily as needed for cough.  Dispense: 120 mL; Refill: 0     Meds ordered this encounter  Medications   amoxicillin -clavulanate (AUGMENTIN ) 875-125 MG tablet    Sig: Take 1 tablet by mouth 2 (two) times daily for 7 days.    Dispense:  14 tablet    Refill:  0   albuterol  (VENTOLIN  HFA) 108 (90 Base) MCG/ACT inhaler    Sig: Inhale 2 puffs into the lungs every 6 (six) hours as needed for wheezing or shortness of breath.    Dispense:  8 g    Refill:  2   guaiFENesin -codeine  100-10 MG/5ML syrup    Sig: Take 5 mLs by mouth 3 (three) times daily as needed for cough.    Dispense:  120 mL    Refill:  0    No orders of the defined types were placed in this encounter.    Follow-up: Return if symptoms worsen or fail to improve.  An After Visit Summary was printed and given to the patient.  Total time spent on today's visit was 31 minutes, including both face-to-face time and nonface-to-face time personally spent on review of chart (labs and imaging), discussing labs and goals, discussing further work-up, treatment options, referrals to specialist if needed, reviewing outside records if pertinent, answering patient's questions, and coordinating care.      Harrie Cedar, FNP [facility] [phone]

## 2023-08-04 NOTE — Assessment & Plan Note (Signed)
 Persistent cough, sinus pressure, and ear drainage. No shortness of breath or chest pain. Patient is on Taltz , an immunosuppressant, which may prolong recovery. 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 -Prescribe Augmentin  for suspected sinus infection.

## 2023-08-04 NOTE — Assessment & Plan Note (Signed)
 Strep test - negative - Try honey and lemon for sore throat - Gargle with warm salt water - Try hot tea to soothe the throat

## 2023-08-04 NOTE — Assessment & Plan Note (Addendum)
 Acute Augmentin 875 mg PO TWICE A DAY for 7 days Educated on possible side effects of medication and that she may try a probiotic for GI symptoms

## 2023-08-09 ENCOUNTER — Other Ambulatory Visit: Payer: Self-pay | Admitting: Physician Assistant

## 2023-08-09 DIAGNOSIS — N3941 Urge incontinence: Secondary | ICD-10-CM

## 2023-08-09 MED ORDER — MIRABEGRON ER 25 MG PO TB24: 25.0000 mg | ORAL_TABLET | Freq: Every day | ORAL | 5 refills | Status: DC

## 2023-08-16 ENCOUNTER — Other Ambulatory Visit: Payer: Self-pay | Admitting: Family Medicine

## 2023-08-16 ENCOUNTER — Other Ambulatory Visit: Payer: Self-pay

## 2023-08-16 ENCOUNTER — Encounter: Payer: Self-pay | Admitting: Physician Assistant

## 2023-08-16 DIAGNOSIS — L4050 Arthropathic psoriasis, unspecified: Secondary | ICD-10-CM

## 2023-08-16 DIAGNOSIS — Z79899 Other long term (current) drug therapy: Secondary | ICD-10-CM

## 2023-08-23 ENCOUNTER — Encounter: Payer: Self-pay | Admitting: Family Medicine

## 2023-08-23 ENCOUNTER — Ambulatory Visit: Payer: 59

## 2023-08-23 DIAGNOSIS — L4050 Arthropathic psoriasis, unspecified: Secondary | ICD-10-CM

## 2023-08-23 LAB — CBC WITH DIFFERENTIAL/PLATELET
Basophils Absolute: 0.1 10*3/uL (ref 0.0–0.2)
Basos: 2 %
EOS (ABSOLUTE): 0.2 10*3/uL (ref 0.0–0.4)
Eos: 4 %
Hematocrit: 49.6 % — ABNORMAL HIGH (ref 34.0–46.6)
Hemoglobin: 16.7 g/dL — ABNORMAL HIGH (ref 11.1–15.9)
Immature Grans (Abs): 0 10*3/uL (ref 0.0–0.1)
Immature Granulocytes: 0 %
Lymphocytes Absolute: 1.7 10*3/uL (ref 0.7–3.1)
Lymphs: 26 %
MCH: 29.3 pg (ref 26.6–33.0)
MCHC: 33.7 g/dL (ref 31.5–35.7)
MCV: 87 fL (ref 79–97)
Monocytes Absolute: 0.6 10*3/uL (ref 0.1–0.9)
Monocytes: 8 %
Neutrophils Absolute: 4 10*3/uL (ref 1.4–7.0)
Neutrophils: 60 %
Platelets: 222 10*3/uL (ref 150–450)
RBC: 5.7 x10E6/uL — ABNORMAL HIGH (ref 3.77–5.28)
RDW: 11.9 % (ref 11.7–15.4)
WBC: 6.6 10*3/uL (ref 3.4–10.8)

## 2023-08-23 LAB — COMPREHENSIVE METABOLIC PANEL
ALT: 21 [IU]/L (ref 0–32)
AST: 20 [IU]/L (ref 0–40)
Albumin: 4.2 g/dL (ref 3.8–4.9)
Alkaline Phosphatase: 54 [IU]/L (ref 44–121)
BUN/Creatinine Ratio: 25 — ABNORMAL HIGH (ref 9–23)
BUN: 19 mg/dL (ref 6–24)
Bilirubin Total: 0.5 mg/dL (ref 0.0–1.2)
CO2: 23 mmol/L (ref 20–29)
Calcium: 9.7 mg/dL (ref 8.7–10.2)
Chloride: 102 mmol/L (ref 96–106)
Creatinine, Ser: 0.76 mg/dL (ref 0.57–1.00)
Globulin, Total: 2 g/dL (ref 1.5–4.5)
Glucose: 73 mg/dL (ref 70–99)
Potassium: 5.2 mmol/L (ref 3.5–5.2)
Sodium: 139 mmol/L (ref 134–144)
Total Protein: 6.2 g/dL (ref 6.0–8.5)
eGFR: 94 mL/min/{1.73_m2} (ref >59)

## 2023-08-24 NOTE — Progress Notes (Signed)
RBC count, hemoglobin, and hematocrit remain elevated. Rest of CBC WNL. CMP stable.

## 2023-08-26 ENCOUNTER — Other Ambulatory Visit: Payer: Self-pay | Admitting: Physician Assistant

## 2023-08-26 DIAGNOSIS — L739 Follicular disorder, unspecified: Secondary | ICD-10-CM

## 2023-09-06 ENCOUNTER — Other Ambulatory Visit: Payer: Self-pay | Admitting: Rheumatology

## 2023-09-06 DIAGNOSIS — L4050 Arthropathic psoriasis, unspecified: Secondary | ICD-10-CM

## 2023-09-06 DIAGNOSIS — Z79899 Other long term (current) drug therapy: Secondary | ICD-10-CM

## 2023-09-06 DIAGNOSIS — L409 Psoriasis, unspecified: Secondary | ICD-10-CM

## 2023-09-07 NOTE — Telephone Encounter (Signed)
Last Fill: 06/22/2023  Labs: 08/23/2023 RBC count, hemoglobin, and hematocrit remain elevated. Rest of CBC WNL. CMP stable.   TB Gold: 11/18/2022 Neg    Next Visit: 10/25/2023  Last Visit: 05/25/2023  NW:[id number] arthritis   Current Dose per office note 05/25/2023: Taltz 80 mg sq inj q28 days   Okay to refill Taltz?

## 2023-09-08 ENCOUNTER — Telehealth: Payer: Self-pay | Admitting: Pharmacist

## 2023-09-08 NOTE — Telephone Encounter (Signed)
Submitted a Prior Authorization RENEWAL request to [facility] for TALTZ via fax. Will update once we receive a response.  CASE # [id number] PHONE: [phone] FAX: [phone]

## 2023-09-08 NOTE — Telephone Encounter (Signed)
Received notification from [facility] regarding a prior authorization for TALTZ. Authorization has been APPROVED from 09/08/2023 to 09/07/2024. Approval letter sent to scan center.  Patient must continue to fill through [facility]: [phone]  Authorization # [id number]  Chesley Mires, PharmD, MPH, BCPS, CPP Clinical Pharmacist (Rheumatology and Pulmonology)

## 2023-10-12 NOTE — Progress Notes (Deleted)
 Office Visit Note  Patient: Kaitlyn Hardy             Date of Birth: [birth year]           MRN: [id number]             PCP: Marianne Sofia, PA-C Referring: Marianne Sofia, PA-C Visit Date: 10/25/2023 Occupation: @GUAROCC @  Subjective:  No chief complaint on file.   History of Present Illness: Kaitlyn Hardy is a 53 y.o. female ***     Activities of Daily Living:  Patient reports morning stiffness for *** {minute/hour:19697}.   Patient {ACTIONS;DENIES/REPORTS:21021675::"Denies"} nocturnal pain.  Difficulty dressing/grooming: {ACTIONS;DENIES/REPORTS:21021675::"Denies"} Difficulty climbing stairs: {ACTIONS;DENIES/REPORTS:21021675::"Denies"} Difficulty getting out of chair: {ACTIONS;DENIES/REPORTS:21021675::"Denies"} Difficulty using hands for taps, buttons, cutlery, and/or writing: {ACTIONS;DENIES/REPORTS:21021675::"Denies"}  No Rheumatology ROS completed.   PMFS History:  Patient Active Problem List   Diagnosis Date Noted  . Acute cough 08/04/2023  . Reactive airway disease 08/04/2023  . Non-recurrent acute suppurative otitis media of both ears with spontaneous rupture of tympanic membranes 08/04/2023  . Acute non-recurrent maxillary sinusitis 08/04/2023  . Pharyngitis 08/04/2023  . Right ear pain 07/12/2023  . Acute diffuse otitis externa of right ear 07/12/2023  . Urinary incontinence, urge 07/05/2023  . Glossitis 11/23/2022  . Folliculitis 11/23/2022  . Inflammatory arthritis 08/26/2022  . Encounter for annual physical exam 08/26/2022  . Vasomotor symptoms due to menopause 08/26/2022  . Absence of bladder continence 08/26/2022  . Bilateral impacted cerumen 08/26/2022  . Psoriatic arthritis (HCC) 09/30/2021  . Psoriasis 09/30/2021  . High risk medication use 09/30/2021  . 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 Right elbow pain 06/08/2019  . Triceps tendinitis 03/20/2018  . Fibroid 06/26/2013    Past Medical History:   Diagnosis Date  . Allergy   . Arthritis    psoriasis   . Encounter for annual physical exam 08/26/2022  . Factor V Leiden (HCC)    recent  dx - single, no problems  . OAB (overactive bladder)   . Reactive airway disease 08/04/2023  . SVD (spontaneous vaginal delivery)    x 2  . Vitamin D deficiency     Family History  Problem Relation Age of Onset  . Pancreatic cancer Mother   . Diabetes Mother   . Thyroid disease Mother   . Fibromyalgia Mother   . Cancer Mother   . Prostate cancer Father   . Cancer Father   . Healthy Son   . Colon cancer Neg Hx   . Rectal cancer Neg Hx   . Stomach cancer Neg Hx    Past Surgical History:  Procedure Laterality Date  . ABDOMINAL HYSTERECTOMY  partial   2014  . ABLATION     done in MD's office   . AUGMENTATION MAMMAPLASTY Bilateral 02/25/1999   saline  . BREAST SURGERY  02/1999   augmentation   . LAPAROSCOPIC ASSISTED VAGINAL HYSTERECTOMY N/A 06/26/2013   Procedure: LAPAROSCOPIC ASSISTED VAGINAL HYSTERECTOMY;  Surgeon: Zelphia Cairo, MD;  Location: [facility];  Service: Gynecology;  Laterality: N/A;  . WISDOM TOOTH EXTRACTION     Social History   Social History Narrative  . Not on file    There is no immunization history on file for this patient.   Objective: Vital Signs: LMP 05/07/2013 (LMP Unknown)    Physical Exam   Musculoskeletal Exam: ***  CDAI Exam: CDAI Score: -- Patient Global: --; Provider Global: -- Swollen: --; Tender: -- Joint Exam 10/25/2023  No joint exam has been documented for this visit   There is currently no information documented on the homunculus. Go to the Rheumatology activity and complete the homunculus joint exam.  Investigation: No additional findings.  Imaging: No results found.  Recent Labs: Lab Results  Component Value Date   WBC 6.6 08/23/2023   HGB 16.7 (H) 08/23/2023   PLT 222 08/23/2023   NA 139 08/23/2023   K 5.2 08/23/2023   CL 102 08/23/2023   CO2 23 08/23/2023   GLUCOSE  73 08/23/2023   BUN 19 08/23/2023   CREATININE 0.76 08/23/2023   BILITOT 0.5 08/23/2023   ALKPHOS 54 08/23/2023   AST 20 08/23/2023   ALT 21 08/23/2023   PROT 6.2 08/23/2023   ALBUMIN 4.2 08/23/2023   CALCIUM 9.7 08/23/2023   GFRAA >60 08/28/2019   QFTBGOLDPLUS Negative 11/18/2022    Speciality Comments: MTX-fatigue,Cosentyx-diarrhea, Humira-inadequate response,  Procedures:  No procedures performed Allergies: Patient has no known allergies.   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  No orders of the defined types were placed in this encounter.   Face-to-face time spent with patient was *** minutes. Greater than 50% of time was spent in counseling and coordination of care.  Follow-Up Instructions: No follow-ups on file.   Gearldine Bienenstock, PA-C  Note - This record has been created using Dragon software.  Chart creation errors have been sought, but may not always  have been located. Such creation errors do not reflect on  the standard of medical care.

## 2023-10-25 ENCOUNTER — Ambulatory Visit: Payer: 59 | Admitting: Physician Assistant

## 2023-10-25 DIAGNOSIS — L4050 Arthropathic psoriasis, unspecified: Secondary | ICD-10-CM

## 2023-10-25 DIAGNOSIS — J302 Other seasonal allergic rhinitis: Secondary | ICD-10-CM

## 2023-10-25 DIAGNOSIS — M19071 Primary osteoarthritis, right ankle and foot: Secondary | ICD-10-CM

## 2023-10-25 DIAGNOSIS — L409 Psoriasis, unspecified: Secondary | ICD-10-CM

## 2023-10-25 DIAGNOSIS — M19041 Primary osteoarthritis, right hand: Secondary | ICD-10-CM

## 2023-10-25 DIAGNOSIS — E559 Vitamin D deficiency, unspecified: Secondary | ICD-10-CM

## 2023-10-25 DIAGNOSIS — M24521 Contracture, right elbow: Secondary | ICD-10-CM

## 2023-10-25 DIAGNOSIS — D6851 Activated protein C resistance: Secondary | ICD-10-CM

## 2023-10-25 DIAGNOSIS — Z79899 Other long term (current) drug therapy: Secondary | ICD-10-CM

## 2023-10-25 DIAGNOSIS — Q6671 Congenital pes cavus, right foot: Secondary | ICD-10-CM

## 2023-10-25 DIAGNOSIS — R5383 Other fatigue: Secondary | ICD-10-CM

## 2023-10-27 ENCOUNTER — Ambulatory Visit: Payer: 59 | Admitting: Rheumatology

## 2023-11-02 MED ORDER — NYSTATIN 100000 UNIT/ML MT SUSP: 5.0000 mL | Freq: Three times a day (TID) | OROMUCOSAL | 0 refills | Status: DC

## 2023-11-02 NOTE — Telephone Encounter (Signed)
Ok to send in magic mouthwash.

## 2023-11-02 NOTE — Telephone Encounter (Signed)
 Please call the patent to clarify the symptoms she is experiencing?

## 2023-11-02 NOTE — Progress Notes (Signed)
 Office Visit Note  Patient: Kaitlyn Hardy             Date of Birth: 06-01-1971           MRN: [id number]             PCP: Cyndi Drain, PA-C Referring: Cyndi Drain, PA-C Visit Date: 11/16/2023 Occupation: @GUAROCC @  Subjective:  Left SI joint pain  History of Present Illness: Harvey Lingo is a 53 y.o. female with history of psoriatic arthritis and osteoarthritis.  Patient remains on Taltz  80 mg sq inj q28 days.  She is tolerating Taltz  without any side effects or injection site reactions.  Patient was recently treated with a course of antibiotics for sinusitis and had to postpone her dose of taltz  by 5-6 days.  She has been experiencing increased pain and stiffness in her lower back since having to space her taltz  dose.  She rates the pain level 5 out of 10 especially during periods of inactivity and at night.  Her lower back pain is alleviated by exercise.  She has not noticed any improvement taking Advil  for pain relief.  Patient is scheduled to have a massage on Friday.  Patient is also having recurrence of pain and inflammation in the left thumb especially in the first MCP joint.  She denies any other joint pain or joint swelling at this time.  She denies any Achilles tendinitis or plantar fasciitis.  She denies any eye pain, photophobia, or eye redness. 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  Patient states that the sores improved by about 75% with the use of Magic mouthwash.  Her prescription is about to expire and the sores are still present.  She has requested a refill of Magic mouthwash to be sent to the pharmacy today.    Activities of Daily Living:  Patient reports morning stiffness for a few minutes.   Patient Reports nocturnal pain.  Difficulty dressing/grooming: Denies Difficulty climbing stairs: Denies Difficulty getting out of chair: Denies Difficulty using hands for taps,  buttons, cutlery, and/or writing: Reports  Review of Systems  Constitutional:  Negative for fatigue.  HENT:  Positive for mouth sores. Negative for mouth dryness and nose dryness.        Nose sore  Eyes:  Negative for pain and dryness.  Respiratory:  Negative for shortness of breath and difficulty breathing.   Cardiovascular:  Negative for chest pain and palpitations.  Gastrointestinal:  Negative for blood in stool, constipation and diarrhea.  Endocrine: Negative for increased urination.  Genitourinary:  Negative for involuntary urination.  Musculoskeletal:  Positive for joint pain, joint pain, myalgias, muscle weakness, morning stiffness, muscle tenderness and myalgias. Negative for gait problem and joint swelling.  Skin:  Positive for rash and sensitivity to sunlight. Negative for color change and hair loss.  Allergic/Immunologic: Positive for susceptible to infections.  Neurological:  Negative for dizziness and headaches.  Hematological:  Negative for swollen glands.  Psychiatric/Behavioral:  Negative for depressed mood and sleep disturbance. The patient is not nervous/anxious.     PMFS History:  Patient Active Problem List   Diagnosis Date Noted   Acute cough 08/04/2023   Reactive airway disease 08/04/2023   Non-recurrent acute suppurative otitis media of both ears with spontaneous rupture of tympanic membranes 08/04/2023   Acute non-recurrent maxillary sinusitis 08/04/2023   Pharyngitis 08/04/2023   Right ear pain 07/12/2023  Acute diffuse otitis externa of right ear 07/12/2023   Urinary incontinence, urge 07/05/2023   Glossitis 11/23/2022   Folliculitis 11/23/2022   Inflammatory arthritis 08/26/2022   Encounter for annual physical exam 08/26/2022   Vasomotor symptoms due to menopause 08/26/2022   Absence of bladder continence 08/26/2022   Bilateral impacted cerumen 08/26/2022   Psoriatic arthritis (HCC) 09/30/2021   Psoriasis 09/30/2021   High risk medication use  09/30/2021   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   Right elbow pain 06/08/2019   Triceps tendinitis 03/20/2018   Fibroid 06/26/2013    Past Medical History:  Diagnosis Date   Allergy    Arthritis    psoriasis    Encounter for annual physical exam 08/26/2022   Factor V Leiden (HCC)    recent  dx - single, no problems   OAB (overactive bladder)    Reactive airway disease 08/04/2023   SVD (spontaneous vaginal delivery)    x 2   Vitamin D deficiency     Family History  Problem Relation Age of Onset   Pancreatic cancer Mother    Diabetes Mother    Thyroid disease Mother    Fibromyalgia Mother    Cancer Mother    Prostate cancer Father    Cancer Father    Healthy Son    Colon cancer Neg Hx    Rectal cancer Neg Hx    Stomach cancer Neg Hx    Past Surgical History:  Procedure Laterality Date   ABDOMINAL HYSTERECTOMY  partial   2014   ABLATION     done in MD's office    AUGMENTATION MAMMAPLASTY Bilateral 02/25/1999   saline   BREAST SURGERY  02/1999   augmentation    LAPAROSCOPIC ASSISTED VAGINAL HYSTERECTOMY N/A 06/26/2013   Procedure: LAPAROSCOPIC ASSISTED VAGINAL HYSTERECTOMY;  Surgeon: Ashby Lawman, MD;  Location: [facility];  Service: Gynecology;  Laterality: N/A;   WISDOM TOOTH EXTRACTION     Social History   Social History Narrative   Not on file    There is no immunization history on file for this patient.   Objective: Vital Signs: BP 137/84 (BP Location: Left Arm, Patient Position: Sitting, Cuff Size: Normal)   Pulse 76   Resp 16   Ht 5\' 5"  (1.651 m)   Wt 162 lb 3.2 oz (73.6 kg)   LMP 05/07/2013 (LMP Unknown)   BMI 26.99 kg/m    Physical Exam Vitals and nursing note reviewed.  Constitutional:      Appearance: She is well-developed.  HENT:     Head: Normocephalic and atraumatic.  Eyes:     Conjunctiva/sclera: Conjunctivae normal.  Cardiovascular:     Rate and Rhythm: Normal rate and regular rhythm.      Heart sounds: Normal heart sounds.  Pulmonary:     Effort: Pulmonary effort is normal.     Breath sounds: Normal breath sounds.  Abdominal:     General: Bowel sounds are normal.     Palpations: Abdomen is soft.  Musculoskeletal:     Cervical back: Normal range of motion.  Lymphadenopathy:     Cervical: No cervical adenopathy.  Skin:    General: Skin is warm and dry.     Capillary Refill: Capillary refill takes less than 2 seconds.  Neurological:     Mental Status: She is alert and oriented to person, place, and time.  Psychiatric:        Behavior: Behavior normal.  Musculoskeletal Exam: C-spine has good ROM.  Mild midline spinal tenderness in the lumbar region.  Tenderness of the left SI joint.  Shoulder joints, elbow joints, wrist joints, MCPs, PIPs, and DIPs good ROM with no synovitis.  Tenderness of the left 1st MCP joint.  Complete fist formation bilaterally.  Hip joints have good ROM with no groin pain.  Knee joints have good ROM with no warmth or effusion.  Ankle joints have good ROM with no tenderness or joint swelling.   CDAI Exam: CDAI Score: -- Patient Global: --; Provider Global: -- Swollen: 0 ; Tender: 2  Joint Exam 11/16/2023      Right  Left  MCP 1      Tender  Sacroiliac      Tender     Investigation: No additional findings.  Imaging: No results found.  Recent Labs: Lab Results  Component Value Date   WBC 6.6 08/23/2023   HGB 16.7 (H) 08/23/2023   PLT 222 08/23/2023   NA 139 08/23/2023   K 5.2 08/23/2023   CL 102 08/23/2023   CO2 23 08/23/2023   GLUCOSE 73 08/23/2023   BUN 19 08/23/2023   CREATININE 0.76 08/23/2023   BILITOT 0.5 08/23/2023   ALKPHOS 54 08/23/2023   AST 20 08/23/2023   ALT 21 08/23/2023   PROT 6.2 08/23/2023   ALBUMIN 4.2 08/23/2023   CALCIUM 9.7 08/23/2023   GFRAA >60 08/28/2019   QFTBGOLDPLUS Negative 11/18/2022    Speciality Comments: MTX-fatigue,Cosentyx-diarrhea, Humira-inadequate response,  Procedures:  No  procedures performed Allergies: Patient has no known allergies.   Assessment / Plan:     Visit Diagnoses: Psoriatic arthritis (HCC) - Dx at [place] [facility]-09/2019: Patient presents today with increased pain in the left first MCP joint and the left SI joint.  She remains on Taltz  80 mg subcutaneous injections once every 28 days.  She recently was treated with a course of antibiotics for sinusitis and postpone her Taltz  injection by 5 to 6 days.  She has had ongoing discomfort in her left SI joint and left thumb since then.  She has tried taking Advil  with no improvement in her symptoms.  She is been experiencing nocturnal pain in her lower back which improves throughout the day with activity.  She has tenderness over the left SI joint on examination today as well as tenderness over the left first MCP joint.  Different treatment options were discussed.  She has an upcoming vacation in 1 week and has requested a prednisone  taper to be sent to the pharmacy.  A prednisone  taper starting 20 mg tapering by 5 mg every 4 days was sent to the pharmacy.  Instructions were provided including taking prednisone  first thing in the morning and avoiding use of NSAIDs.  She was advised to notify us  if her symptoms persist or worsen.  She will remain on Taltz  as prescribed.  She will follow-up in the office in 5 months or sooner if needed.  Psoriasis - History of hyperkeratosis, hyperpigmentation, and diffuse erythema noted on the anterior aspect of both shins distal to both knees.  She is using aquaphor as a daily moisturizer.  No new lesions.  High risk medication use - Taltz  80 mg sq inj q28 days. restarted 06/06/2022.(Taltz  80 mg-d/c July 2023 initiated in March 2022.).  CBC and CMP updated on 08/23/23.  Orders for CBC and CMP released today. Her next lab work will be due in July and every 3 months. Standing orders for CBC and CMP placed  today. TB gold negative on 11/18/22. Order for TB gold released today.   No recurrent  infections.  Discussed the importance of holding taltz  if she develops signs or symptoms of an infection and to resume once the infection has completely cleared.   - Plan: CBC with Differential/Platelet, Comprehensive metabolic panel with GFR, QuantiFERON-TB Gold Plus, CBC with Differential/Platelet, Comprehensive metabolic panel with GFR  Screening for tuberculosis - Order for TB gold released today.  Plan: QuantiFERON-TB Gold Plus  Primary osteoarthritis of both hands: She has PIP and DIP thickening consistent with osteoarthritis of both hands.  She has tenderness of the left first MCP joint.  Contracture of right elbow: No tenderness or inflammation noted.  Primary osteoarthritis of both feet: She is not experiencing any increased discomfort in her feet at this time.  No evidence of Achilles tendinitis or plantar fasciitis.  She is wearing proper fitting shoes.  Pes cavus of both feet: She is wearing proper fitting shoes.   Glossitis -   History of oral lichen planus--a prescription for Magic mouthwash was sent on 11/02/2023 which alleviated her sores by about 75%.  She has not noticed any new sores but the current sores have not completely healed--she requested a refill of Magic mouthwash to be sent to the pharmacy today.  Plan: magic mouthwash (nystatin , hydrocortisone , diphenhydrAMINE ) suspension  Other medical conditions are listed as follows:   Vitamin D deficiency  Other fatigue: Stable.   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    Dispense:  540 mL    Refill:  0     Follow-Up Instructions: Return in about 5 months (around 04/17/2024) for Psoriatic arthritis.   Romayne Clubs, PA-C  Note - This record has been created using Dragon software.  Chart creation errors have been sought, but may not always  have been located. Such creation errors do not reflect on  the standard of medical care.

## 2023-11-16 ENCOUNTER — Ambulatory Visit: Attending: Physician Assistant | Admitting: Physician Assistant

## 2023-11-16 ENCOUNTER — Encounter: Payer: Self-pay | Admitting: Physician Assistant

## 2023-11-16 VITALS — BP 137/84 | HR 76 | Resp 16 | Ht 65.0 in | Wt 162.2 lb

## 2023-11-16 DIAGNOSIS — L409 Psoriasis, unspecified: Secondary | ICD-10-CM

## 2023-11-16 DIAGNOSIS — J302 Other seasonal allergic rhinitis: Secondary | ICD-10-CM

## 2023-11-16 DIAGNOSIS — M19042 Primary osteoarthritis, left hand: Secondary | ICD-10-CM

## 2023-11-16 DIAGNOSIS — E559 Vitamin D deficiency, unspecified: Secondary | ICD-10-CM

## 2023-11-16 DIAGNOSIS — L4050 Arthropathic psoriasis, unspecified: Secondary | ICD-10-CM | POA: Diagnosis not present

## 2023-11-16 DIAGNOSIS — Z79899 Other long term (current) drug therapy: Secondary | ICD-10-CM | POA: Diagnosis not present

## 2023-11-16 DIAGNOSIS — M19071 Primary osteoarthritis, right ankle and foot: Secondary | ICD-10-CM

## 2023-11-16 DIAGNOSIS — K140 Glossitis: Secondary | ICD-10-CM

## 2023-11-16 DIAGNOSIS — R5383 Other fatigue: Secondary | ICD-10-CM

## 2023-11-16 DIAGNOSIS — Z111 Encounter for screening for respiratory tuberculosis: Secondary | ICD-10-CM

## 2023-11-16 DIAGNOSIS — Q6672 Congenital pes cavus, left foot: Secondary | ICD-10-CM

## 2023-11-16 DIAGNOSIS — D6851 Activated protein C resistance: Secondary | ICD-10-CM

## 2023-11-16 DIAGNOSIS — M24521 Contracture, right elbow: Secondary | ICD-10-CM

## 2023-11-16 DIAGNOSIS — Q6671 Congenital pes cavus, right foot: Secondary | ICD-10-CM

## 2023-11-16 DIAGNOSIS — M19041 Primary osteoarthritis, right hand: Secondary | ICD-10-CM

## 2023-11-16 DIAGNOSIS — M19072 Primary osteoarthritis, left ankle and foot: Secondary | ICD-10-CM

## 2023-11-16 MED ORDER — PREDNISONE 5 MG PO TABS: ORAL_TABLET | ORAL | 0 refills | Status: DC

## 2023-11-16 MED ORDER — NYSTATIN 100000 UNIT/ML MT SUSP: 5.0000 mL | Freq: Three times a day (TID) | OROMUCOSAL | 0 refills | Status: DC

## 2023-11-16 NOTE — Patient Instructions (Signed)
Standing Labs We placed an order today for your standing lab work.   Please have your standing labs drawn at the end of July and every 3 months   Please have your labs drawn 2 weeks prior to your appointment so that the provider can discuss your lab results at your appointment, if possible.  Please note that you may see your imaging and lab results in MyChart before we have reviewed them. We will contact you once all results are reviewed. Please allow our office up to 72 hours to thoroughly review all of the results before contacting the office for clarification of your results.  WALK-IN LAB HOURS  Monday through Thursday from 8:00 am -12:30 pm and 1:00 pm-5:00 pm and Friday from 8:00 am-12:00 pm.  Patients with office visits requiring labs will be seen before walk-in labs.  You may encounter longer than normal wait times. Please allow additional time. Wait times may be shorter on  Monday and Thursday afternoons.  We do not book appointments for walk-in labs. We appreciate your patience and understanding with our staff.   Labs are drawn by [facility]. Please bring your co-pay at the time of your lab draw.  You may receive a bill from [facility] for your lab work.  Please note if you are on Hydroxychloroquine and and an order has been placed for a Hydroxychloroquine level,  you will need to have it drawn 4 hours or more after your last dose.  If you wish to have your labs drawn at another location, please call the office 24 hours in advance so we can fax the orders.  The office is located at [address], [address], [place], [place] [address]   If you have any questions regarding directions or hours of operation,  please call [phone].   As a reminder, please drink plenty of water prior to coming for your lab work. Thanks!  

## 2023-11-16 NOTE — Progress Notes (Signed)
 White blood cell count, hemoglobin, hematocrit remain borderline elevated but have improved.

## 2023-11-17 NOTE — Progress Notes (Signed)
 CMP WNL

## 2023-11-18 LAB — COMPREHENSIVE METABOLIC PANEL WITH GFR
AG Ratio: 2.2 (calc) (ref 1.0–2.5)
ALT: 19 U/L (ref 6–29)
AST: 20 U/L (ref 10–35)
Albumin: 4.3 g/dL (ref 3.6–5.1)
Alkaline phosphatase (APISO): 50 U/L (ref 37–153)
BUN: 19 mg/dL (ref 7–25)
CO2: 26 mmol/L (ref 20–32)
Calcium: 9.4 mg/dL (ref 8.6–10.4)
Chloride: 106 mmol/L (ref 98–110)
Creat: 0.65 mg/dL (ref 0.50–1.03)
Globulin: 2 g/dL (ref 1.9–3.7)
Glucose, Bld: 87 mg/dL (ref 65–99)
Potassium: 4.5 mmol/L (ref 3.5–5.3)
Sodium: 140 mmol/L (ref 135–146)
Total Bilirubin: 0.5 mg/dL (ref 0.2–1.2)
Total Protein: 6.3 g/dL (ref 6.1–8.1)
eGFR: 105 mL/min/{1.73_m2} (ref >=60)

## 2023-11-18 LAB — CBC WITH DIFFERENTIAL/PLATELET
Absolute Lymphocytes: 1997 {cells}/uL (ref 850–3900)
Absolute Monocytes: 499 {cells}/uL (ref 200–950)
Basophils Absolute: 77 {cells}/uL (ref 0–200)
Basophils Relative: 1.2 %
Eosinophils Absolute: 147 {cells}/uL (ref 15–500)
Eosinophils Relative: 2.3 %
HCT: 47.7 % — ABNORMAL HIGH (ref 35.0–45.0)
Hemoglobin: 15.8 g/dL — ABNORMAL HIGH (ref 11.7–15.5)
MCH: 28.9 pg (ref 27.0–33.0)
MCHC: 33.1 g/dL (ref 32.0–36.0)
MCV: 87.4 fL (ref 80.0–100.0)
MPV: 11.2 fL (ref 7.5–12.5)
Monocytes Relative: 7.8 %
Neutro Abs: 3680 {cells}/uL (ref 1500–7800)
Neutrophils Relative %: 57.5 %
Platelets: 204 10*3/uL (ref 140–400)
RBC: 5.46 10*6/uL — ABNORMAL HIGH (ref 3.80–5.10)
RDW: 12.2 % (ref 11.0–15.0)
Total Lymphocyte: 31.2 %
WBC: 6.4 10*3/uL (ref 3.8–10.8)

## 2023-11-18 LAB — QUANTIFERON-TB GOLD PLUS
Mitogen-NIL: 6.57 [IU]/mL
NIL: 0.04 [IU]/mL
QuantiFERON-TB Gold Plus: NEGATIVE
TB1-NIL: <0 [IU]/mL
TB2-NIL: 0 [IU]/mL

## 2023-11-18 NOTE — Progress Notes (Signed)
 TB gold negative

## 2023-12-06 ENCOUNTER — Other Ambulatory Visit: Payer: Self-pay | Admitting: Physician Assistant

## 2023-12-06 DIAGNOSIS — L4050 Arthropathic psoriasis, unspecified: Secondary | ICD-10-CM

## 2023-12-06 DIAGNOSIS — L409 Psoriasis, unspecified: Secondary | ICD-10-CM

## 2023-12-06 DIAGNOSIS — Z79899 Other long term (current) drug therapy: Secondary | ICD-10-CM

## 2023-12-07 NOTE — Telephone Encounter (Signed)
 Last Fill: 09/07/2023  Labs: 11/16/2023 White blood cell count, hemoglobin, hematocrit remain borderline elevated but have improved.  CMP WNL   TB Gold: 11/16/2023 TB gold negative    Next Visit: 04/26/2024  Last Visit: 11/16/2023  DX: Psoriatic arthritis   Current Dose per office note 11/16/2023: Taltz  80 mg subcutaneous injections once every 28 days   Okay to refill Taltz ?

## 2023-12-08 ENCOUNTER — Ambulatory Visit (INDEPENDENT_AMBULATORY_CARE_PROVIDER_SITE_OTHER)
Admission: RE | Admit: 2023-12-08 | Discharge: 2023-12-08 | Disposition: A | Discharge status: Home / Self Care | Source: Ambulatory Visit | Attending: Physician Assistant | Admitting: Physician Assistant

## 2023-12-08 ENCOUNTER — Ambulatory Visit: Payer: Self-pay | Admitting: Physician Assistant

## 2023-12-08 ENCOUNTER — Encounter: Payer: Self-pay | Admitting: Physician Assistant

## 2023-12-08 ENCOUNTER — Ambulatory Visit: Payer: Self-pay

## 2023-12-08 ENCOUNTER — Ambulatory Visit: Admitting: Physician Assistant

## 2023-12-08 VITALS — BP 108/72 | HR 72 | Temp 98.2°F | Resp 18 | Ht 65.0 in | Wt 162.2 lb

## 2023-12-08 DIAGNOSIS — M5442 Lumbago with sciatica, left side: Secondary | ICD-10-CM

## 2023-12-08 MED ORDER — CYCLOBENZAPRINE HCL 10 MG PO TABS: 10.0000 mg | ORAL_TABLET | Freq: Three times a day (TID) | ORAL | 0 refills | Status: AC | PRN

## 2023-12-08 MED ORDER — PREDNISONE 20 MG PO TABS
ORAL_TABLET | ORAL | 0 refills | Status: DC
Start: 2023-12-08 — End: 2023-12-17

## 2023-12-08 NOTE — Telephone Encounter (Signed)
  Chief Complaint: back pain Symptoms: lower back pain, radiates down L leg Frequency: 1.5 weeks Pertinent Negatives: Patient denies fever, urinary s/s, injury,  Disposition: [] ED /[] Urgent Care (no appt availability in office) / [x] Appointment(In office/virtual)/ []  [facility]/ [] Home Care/ [] Refused Recommended Disposition /[] [facility]/ []  Follow-up with PCP Additional Notes: Pt states that she has lower back pain, never had this before. Hx of psoriatic arthritis. Denies injury. States that it now radiates down L leg, causes tingling in that leg at times. Advil  helps.  Copied from CRM [id number]. Topic: Clinical - Red Word Triage >> Dec 08, 2023  8:48 AM Oddis Bench wrote: Red Word that prompted transfer to Nurse Triage: Patient is calling about lower back and leg pain in left leg. Has been going on for  a week and a half. Leg has tenderness Reason for Disposition  Numbness in a leg or foot (i.e., loss of sensation)  Answer Assessment - Initial Assessment Questions 1. ONSET: "When did the pain begin?"      1.5 weeks 2. LOCATION: "Where does it hurt?" (upper, mid or lower back)     Lower, goes into L leg now 3. SEVERITY: "How bad is the pain?"  (e.g., Scale 1-10; mild, moderate, or severe)   - MILD (1-3): Doesn't interfere with normal activities.    - MODERATE (4-7): Interferes with normal activities or awakens from sleep.    - SEVERE (8-10): Excruciating pain, unable to do any normal activities.      Varies, worse at night and AM, worse is 5-6 4. PATTERN: "Is the pain constant?" (e.g., yes, no; constant, intermittent)      Intermittent, believes this is d/t advil  5. RADIATION: "Does the pain shoot into your legs or somewhere else?"     Radiates to L leg 6. CAUSE:  "What do you think is causing the back pain?"      denies 7. BACK OVERUSE:  "Any recent lifting of heavy objects, strenuous work or exercise?"     denies 8. MEDICINES: "What have you taken so far for  the pain?" (e.g., nothing, acetaminophen , NSAIDS)     Advil -helps 9. NEUROLOGIC SYMPTOMS: "Do you have any weakness, numbness, or problems with bowel/bladder control?"     Tingling L leg intermittently 10. OTHER SYMPTOMS: "Do you have any other symptoms?" (e.g., fever, abdomen pain, burning with urination, blood in urine)       denies  Protocols used: Back Pain-A-AH

## 2023-12-08 NOTE — Progress Notes (Signed)
 Acute Office Visit  Subjective:    Patient ID: Kaitlyn Hardy, female    DOB: 11-10-1970, 53 y.o.   MRN: [id number]  Chief Complaint  Patient presents with   Back Pain    Lower    HPI: Patient is in today for complaints of low back pain with pain radiation down left leg She states the pain started about 3 weeks ago - cannot recall any specific injury or trauma but does work on cattle farm Pt does have psoriatic arthritis and took low dose of prednisone  which helped but now in the past week the pain has recurred and worsened She feels most of discomfort down left leg and is noticing left leg is starting to feel weaker   Current Outpatient Medications:    albuterol  (VENTOLIN  HFA) 108 (90 Base) MCG/ACT inhaler, Inhale 2 puffs into the lungs every 6 (six) hours as needed for wheezing or shortness of breath., Disp: 8 g, Rfl: 2   cholecalciferol (VITAMIN D) 1000 UNITS tablet, Take 2,000 Units by mouth every other day., Disp: , Rfl:    cyclobenzaprine  (FLEXERIL ) 10 MG tablet, Take 1 tablet (10 mg total) by mouth 3 (three) times daily as needed for muscle spasms., Disp: 60 tablet, Rfl: 0   estradiol (CLIMARA - DOSED IN MG/24 HR) 0.05 mg/24hr patch, Place 0.05 mg onto the skin once a week., Disp: , Rfl:    Fexofenadine-Pseudoephedrine (ALLEGRA-D PO), Take by mouth daily., Disp: , Rfl:    ibuprofen  (ADVIL ) 800 MG tablet, Take 1 tablet (800 mg total) by mouth every 8 (eight) hours as needed., Disp: 90 tablet, Rfl: 1   MAGNESIUM PO, Take by mouth at bedtime., Disp: , Rfl:    mirabegron  ER (MYRBETRIQ ) 25 MG TB24 tablet, Take 1 tablet (25 mg total) by mouth daily., Disp: 30 tablet, Rfl: 5   Multiple Vitamin (MULTIVITAMIN) capsule, Take 1 capsule by mouth daily., Disp: , Rfl:    predniSONE  (DELTASONE ) 20 MG tablet, 1 po tid for 3 days then 1 po bid for 3 days then 1 po qd for 3 days, Disp: 18 tablet, Rfl: 0   Probiotic Product (PROBIOTIC PO), Take by mouth daily., Disp: , Rfl:    TALTZ  80 MG/ML  pen, INJECT 1 PEN UNDER THE SKIN EVERY 4 WEEKS, Disp: 3 mL, Rfl: 0  No Known Allergies  ROS CONSTITUTIONAL: Negative for chills, fatigue, fever,  CARDIOVASCULAR: Negative for chest pain, dizziness,  RESPIRATORY: Negative for recent cough and dyspnea.   MSK: see HPI INTEGUMENTARY: Negative for rash.  NEUROLOGICAL: see HPI      Objective:    PHYSICAL EXAM:   BP 108/72   Pulse 72   Temp 98.2 F (36.8 C) (Temporal)   Resp 18   Ht 5\' 5"  (1.651 m)   Wt 162 lb 3.2 oz (73.6 kg)   LMP 05/07/2013 (LMP Unknown)   SpO2 96%   BMI 26.99 kg/m    GEN: Well nourished, well developed, in no acute distress   Cardiac: RRR; no murmurs, rubs, or gallops,no edema -  Respiratory:  normal respiratory rate and pattern with no distress - normal breath sounds with no rales, rhonchi, wheezes or rubs  MS: no deformity or atrophy SLR positive on left at 30 degrees  Neuro:  Alert and Oriented x 3, - CN II-Xii grossly intact Psych: euthymic mood, appropriate affect and demeanor     Assessment & Plan:    Acute left-sided low back pain with left-sided sciatica -  DG Lumbar Spine Complete; Future -     Cyclobenzaprine  HCl; Take 1 tablet (10 mg total) by mouth 3 (three) times daily as needed for muscle spasms.  Dispense: 60 tablet; Refill: 0 -     predniSONE ; 1 po tid for 3 days then 1 po bid for 3 days then 1 po qd for 3 days  Dispense: 18 tablet; Refill: 0   Rom exercises given  Follow-up: Return if symptoms worsen or fail to improve.  An After Visit Summary was printed and given to the patient.  Anthonette Bastos [facility] [phone]

## 2023-12-10 ENCOUNTER — Encounter: Payer: Self-pay | Admitting: Physician Assistant

## 2023-12-13 ENCOUNTER — Other Ambulatory Visit: Payer: Self-pay | Admitting: Physician Assistant

## 2023-12-13 DIAGNOSIS — M5442 Lumbago with sciatica, left side: Secondary | ICD-10-CM

## 2023-12-17 ENCOUNTER — Other Ambulatory Visit: Payer: Self-pay | Admitting: Physician Assistant

## 2023-12-17 MED ORDER — PREDNISONE 20 MG PO TABS: ORAL_TABLET | ORAL | 0 refills | Status: DC

## 2023-12-22 NOTE — Telephone Encounter (Signed)
 The information regarding the denial is attached to Pt's referral. I sent the message to the provider on yesterday.

## 2023-12-23 NOTE — Telephone Encounter (Signed)
 Spoke with patient stated she will contact us  via mychart with the place she would like to go to PT, if she decides she wants to do it. She also stated she will call our office to schedule a 6 week follow up with Dwain Giovanni. Patient also stated she may pay out of pocket for the MRI.

## 2023-12-23 NOTE — Telephone Encounter (Signed)
 This is from the insurance: Your doctor told us  that you are having lower back pain that travels to your hip and/or leg.  An imaging study was asked for. We cannot approve this request because: Imaging can be done when results of a detailed exam that included your nervous system  show a reason for the imaging. This exam must have been performed after your symptoms started or changed. The notes sent to us  do not describe these exam results. Imaging requires six weeks of provider directed treatment to be completed. This must have  been completed in the past three months without improved symptoms. Contact (via office  visit, phone, email, or messaging) must occur after the treatment is completed. This has not  been met because: The notes sent to us  do not show you have completed a full six weeks of this type of treatment. Your treatment did not occur within the last 12 weeks. Symptoms must be the same or worse after treatment to support imaging.  I think physical therapy is needed.

## 2023-12-27 NOTE — Telephone Encounter (Signed)
 Ok to schedule visit to assess for need of SI joint injection

## 2023-12-29 ENCOUNTER — Encounter: Payer: Self-pay | Admitting: Physician Assistant

## 2023-12-29 ENCOUNTER — Telehealth: Payer: Self-pay

## 2023-12-29 MED ORDER — NYSTATIN 100000 UNIT/ML MT SUSP: 5.0000 mL | Freq: Three times a day (TID) | OROMUCOSAL | 0 refills | Status: AC

## 2023-12-29 NOTE — Progress Notes (Deleted)
   Procedure Note  Patient: Kaitlyn Hardy             Date of Birth: 1970-08-13           MRN: [id number]             Visit Date: 12/31/2023  Procedures: Visit Diagnoses:  1. Chronic left SI joint pain   2. Psoriatic arthropathy [facility])   Patient was evaluated on November 16, 2023 by Linnette Richer, PA-C.  She has known history of psoriatic arthritis.  She continues to have chronic discomfort in her left SI joint.  She was given a prednisone  taper at the last visit.  She has not noticed any improvement.  She came today to get a cortisone injection to her left SI joint.  No procedures performed

## 2023-12-29 NOTE — Telephone Encounter (Signed)
Ok to send in magic mouthwash.

## 2023-12-29 NOTE — Telephone Encounter (Signed)
 Copied from CRM [id number]. Topic: Clinical - Request for Lab/Test Order >> Dec 29, 2023  4:57 PM Everette C wrote: Reason for CRM: Candice with [employer] has called to request an imaging order for an MRI Lumbar Spine without contrast please be submitted to [facility] via fax at [phone]  Please contact Candice further if needed at [phone]

## 2023-12-30 ENCOUNTER — Other Ambulatory Visit (HOSPITAL_BASED_OUTPATIENT_CLINIC_OR_DEPARTMENT_OTHER): Payer: Self-pay | Admitting: Radiology

## 2023-12-30 NOTE — Telephone Encounter (Signed)
 done

## 2023-12-31 ENCOUNTER — Ambulatory Visit: Admitting: Rheumatology

## 2023-12-31 DIAGNOSIS — L4050 Arthropathic psoriasis, unspecified: Secondary | ICD-10-CM

## 2023-12-31 DIAGNOSIS — G8929 Other chronic pain: Secondary | ICD-10-CM

## 2024-01-05 ENCOUNTER — Telehealth: Payer: Self-pay

## 2024-01-05 NOTE — Telephone Encounter (Signed)
 Copied from CRM [id number]. Topic: Clinical - Request for Lab/Test Order >> Jan 05, 2024 12:16 PM Marissa P wrote: Reason for CRM: Lauren from [facility] called to request an order to be faxed in for this patient at [phone], needing it by tomorrow please.

## 2024-01-09 ENCOUNTER — Encounter: Payer: Self-pay | Admitting: Physician Assistant

## 2024-01-10 ENCOUNTER — Encounter: Payer: Self-pay | Admitting: Physician Assistant

## 2024-01-12 ENCOUNTER — Other Ambulatory Visit: Payer: Self-pay

## 2024-01-12 ENCOUNTER — Other Ambulatory Visit: Payer: Self-pay | Admitting: Physician Assistant

## 2024-01-12 ENCOUNTER — Telehealth: Payer: Self-pay

## 2024-01-12 ENCOUNTER — Ambulatory Visit: Payer: Self-pay | Admitting: Physician Assistant

## 2024-01-12 DIAGNOSIS — M5442 Lumbago with sciatica, left side: Secondary | ICD-10-CM

## 2024-01-12 DIAGNOSIS — M5127 Other intervertebral disc displacement, lumbosacral region: Secondary | ICD-10-CM

## 2024-01-12 NOTE — Telephone Encounter (Signed)
 Called patient to give MRI results. Left voicemail for patient to call office back.

## 2024-01-12 NOTE — Telephone Encounter (Unsigned)
 Copied from CRM [id number]. Topic: General - Other >> Jan 12, 2024  3:45 PM Santiya F wrote: Reason for CRM: Patient calling in requesting to speak with Jaqua. Patient says she has another question she would like to ask her

## 2024-01-12 NOTE — Telephone Encounter (Signed)
 Called patient back left voicemail to call office back in the morning

## 2024-01-12 NOTE — Telephone Encounter (Signed)
 Patient called back, made aware of MRI results, and who like referral sent to neurosurgeon in [place].

## 2024-02-09 ENCOUNTER — Encounter: Payer: Self-pay | Admitting: Physician Assistant

## 2024-02-12 ENCOUNTER — Other Ambulatory Visit: Payer: Self-pay | Admitting: Physician Assistant

## 2024-02-12 DIAGNOSIS — N3941 Urge incontinence: Secondary | ICD-10-CM

## 2024-02-16 ENCOUNTER — Telehealth: Payer: Self-pay | Admitting: Rheumatology

## 2024-02-16 ENCOUNTER — Telehealth: Payer: Self-pay

## 2024-02-16 NOTE — Telephone Encounter (Signed)
 Kaitlyn Hardy from [facility] called asking if the patient only needs her CMP & CBC done for labs in their office. Laney states you can send her a private message on epic to confirm that would be great!

## 2024-02-16 NOTE — Telephone Encounter (Signed)
 Called patient and schedule her physical with camie Moats, PA-C on 03/01/24. And made her aware she can get labs done than.Called Dr. Waddell office to make sure they only need CBC and CMP as is what I see in order review. They stated they will have a nurse contact me back to verify.  Copied from CRM [id number]. Topic: Clinical - Request for Lab/Test Order >> Feb 16, 2024  8:04 AM Larissa RAMAN wrote: Reason for CRM: Patient requesting to have labs completed. Please contact for scheduling.

## 2024-02-16 NOTE — Telephone Encounter (Signed)
 Message sent to Laney Ferries at [facility], patient needs CBC w/Diff and CMP w/GFR.

## 2024-02-25 NOTE — Telephone Encounter (Signed)
 Patient is calling to calling her CPE and KEEP her lab appointment for the earliest possible that she can come in on Wednesday -for labs for Dr. Adrian office. Please advise CB- [phone]

## 2024-03-01 ENCOUNTER — Ambulatory Visit: Admitting: Physician Assistant

## 2024-03-08 ENCOUNTER — Other Ambulatory Visit

## 2024-03-08 DIAGNOSIS — Z79899 Other long term (current) drug therapy: Secondary | ICD-10-CM

## 2024-03-09 ENCOUNTER — Ambulatory Visit: Payer: Self-pay | Admitting: Physician Assistant

## 2024-03-09 LAB — CBC WITH DIFFERENTIAL/PLATELET
Basophils Absolute: 0.1 x10E3/uL (ref 0.0–0.2)
Basos: 1 %
EOS (ABSOLUTE): 0.3 x10E3/uL (ref 0.0–0.4)
Eos: 4 %
Hematocrit: 50.5 % — ABNORMAL HIGH (ref 34.0–46.6)
Hemoglobin: 16.6 g/dL — ABNORMAL HIGH (ref 11.1–15.9)
Immature Grans (Abs): 0 x10E3/uL (ref 0.0–0.1)
Immature Granulocytes: 0 %
Lymphocytes Absolute: 2 x10E3/uL (ref 0.7–3.1)
Lymphs: 31 %
MCH: 29.9 pg (ref 26.6–33.0)
MCHC: 32.9 g/dL (ref 31.5–35.7)
MCV: 91 fL (ref 79–97)
Monocytes Absolute: 0.5 x10E3/uL (ref 0.1–0.9)
Monocytes: 7 %
Neutrophils Absolute: 3.7 x10E3/uL (ref 1.4–7.0)
Neutrophils: 57 %
Platelets: 201 x10E3/uL (ref 150–450)
RBC: 5.56 x10E6/uL — ABNORMAL HIGH (ref 3.77–5.28)
RDW: 12.1 % (ref 11.7–15.4)
WBC: 6.6 x10E3/uL (ref 3.4–10.8)

## 2024-03-09 LAB — COMPREHENSIVE METABOLIC PANEL WITH GFR
ALT: 23 IU/L (ref 0–32)
AST: 21 IU/L (ref 0–40)
Albumin: 4.1 g/dL (ref 3.8–4.9)
Alkaline Phosphatase: 47 IU/L (ref 44–121)
BUN/Creatinine Ratio: 23 (ref 9–23)
BUN: 15 mg/dL (ref 6–24)
Bilirubin Total: 0.9 mg/dL (ref 0.0–1.2)
CO2: 23 mmol/L (ref 20–29)
Calcium: 9.2 mg/dL (ref 8.7–10.2)
Chloride: 103 mmol/L (ref 96–106)
Creatinine, Ser: 0.66 mg/dL (ref 0.57–1.00)
Globulin, Total: 1.8 g/dL (ref 1.5–4.5)
Glucose: 102 mg/dL — ABNORMAL HIGH (ref 70–99)
Potassium: 5.4 mmol/L — ABNORMAL HIGH (ref 3.5–5.2)
Sodium: 140 mmol/L (ref 134–144)
Total Protein: 5.9 g/dL — ABNORMAL LOW (ref 6.0–8.5)
eGFR: 104 mL/min/1.73 (ref >59)

## 2024-03-13 ENCOUNTER — Telehealth: Payer: Self-pay | Admitting: *Deleted

## 2024-03-13 ENCOUNTER — Other Ambulatory Visit: Payer: Self-pay | Admitting: Physician Assistant

## 2024-03-13 DIAGNOSIS — L4050 Arthropathic psoriasis, unspecified: Secondary | ICD-10-CM

## 2024-03-13 DIAGNOSIS — R718 Other abnormality of red blood cells: Secondary | ICD-10-CM

## 2024-03-13 DIAGNOSIS — L409 Psoriasis, unspecified: Secondary | ICD-10-CM

## 2024-03-13 DIAGNOSIS — Z79899 Other long term (current) drug therapy: Secondary | ICD-10-CM

## 2024-03-13 DIAGNOSIS — D582 Other hemoglobinopathies: Secondary | ICD-10-CM

## 2024-03-13 NOTE — Telephone Encounter (Signed)
 Attempted to contact the patient and left message for patient to call the office.

## 2024-03-13 NOTE — Addendum Note (Signed)
 Addended by: CENA ALFONSO CROME on: 03/13/2024 05:01 PM   Modules accepted: Orders

## 2024-03-13 NOTE — Telephone Encounter (Signed)
 Left message to advise patient may be worth being re-evaluated by hematology. Referral placed for patient.

## 2024-03-13 NOTE — Telephone Encounter (Signed)
 May be worth being re-evaluated by hematology.

## 2024-03-13 NOTE — Telephone Encounter (Signed)
-----   Message from Waddell CHRISTELLA Craze sent at 03/13/2024 11:28 AM EDT ----- Reviewed lab results.  Hgb and Hct remain elevated but are slightly higher than usual-please clarify if she has undergone a workup by hematology in the past? ----- Message ----- From: Ranee Alix FERNS, CMA Sent: 03/10/2024  10:15 AM EDT To: Maya Nash, MD; Waddell CHRISTELLA Craze, PA-C; S#  Please review

## 2024-03-13 NOTE — Telephone Encounter (Signed)
 Patient returned call to the office. Patient advised Hgb and Hct remain elevated but are slightly higher than usual. Patient states she has seen hematology in the past but it has been a long time. Patient states the reason for that referral was because she had an extensive set of lab work taht showed she had Factor V Leiden. Patient states she will be willing to be referred to hematology if that is what you feel lis best. Patient states she would prefer the [place] area or [place] area.

## 2024-03-14 NOTE — Telephone Encounter (Signed)
 Last Fill: 12/07/2023  Labs: 03/08/2024 RBC 5.56 Hemoglobin 16.6 Hematocrit 50.5 Glucose 102 Potassium 5.4 Total Protein 5.9  TB Gold: 11/16/2023 TB Gold Negative    Next Visit: 04/27/2024  Last Visit: 11/16/2023  IK:Ednmpjupr arthritis   Current Dose per office note 11/16/2023: Taltz  80 mg sq inj q28 days   Okay to refill Taltz ?

## 2024-04-11 ENCOUNTER — Inpatient Hospital Stay: Attending: Hematology & Oncology

## 2024-04-11 ENCOUNTER — Inpatient Hospital Stay: Admitting: Family

## 2024-04-11 ENCOUNTER — Encounter: Payer: Self-pay | Admitting: Family

## 2024-04-11 ENCOUNTER — Other Ambulatory Visit: Payer: Self-pay | Admitting: Family

## 2024-04-11 VITALS — BP 127/86 | HR 78 | Temp 98.0°F | Resp 17 | Ht 65.0 in | Wt 156.0 lb

## 2024-04-11 DIAGNOSIS — D751 Secondary polycythemia: Secondary | ICD-10-CM

## 2024-04-11 DIAGNOSIS — Z79899 Other long term (current) drug therapy: Secondary | ICD-10-CM | POA: Diagnosis not present

## 2024-04-11 DIAGNOSIS — R7402 Elevation of levels of lactic acid dehydrogenase (LDH): Secondary | ICD-10-CM | POA: Diagnosis not present

## 2024-04-11 LAB — CBC WITH DIFFERENTIAL (CANCER CENTER ONLY)
Abs Immature Granulocytes: 0.04 K/uL (ref 0.00–0.07)
Basophils Absolute: 0.1 K/uL (ref 0.0–0.1)
Basophils Relative: 1 %
Eosinophils Absolute: 0.2 K/uL (ref 0.0–0.5)
Eosinophils Relative: 3 %
HCT: 49.3 % — ABNORMAL HIGH (ref 36.0–46.0)
Hemoglobin: 16.6 g/dL — ABNORMAL HIGH (ref 12.0–15.0)
Immature Granulocytes: 1 %
Lymphocytes Relative: 30 %
Lymphs Abs: 1.8 K/uL (ref 0.7–4.0)
MCH: 29.8 pg (ref 26.0–34.0)
MCHC: 33.7 g/dL (ref 30.0–36.0)
MCV: 88.5 fL (ref 80.0–100.0)
Monocytes Absolute: 0.4 K/uL (ref 0.1–1.0)
Monocytes Relative: 7 %
Neutro Abs: 3.7 K/uL (ref 1.7–7.7)
Neutrophils Relative %: 58 %
Platelet Count: 186 K/uL (ref 150–400)
RBC: 5.57 MIL/uL — ABNORMAL HIGH (ref 3.87–5.11)
RDW: 11.9 % (ref 11.5–15.5)
WBC Count: 6.2 K/uL (ref 4.0–10.5)
nRBC: 0 % (ref 0.0–0.2)

## 2024-04-11 LAB — LACTATE DEHYDROGENASE: LDH: 210 U/L — ABNORMAL HIGH (ref 98–192)

## 2024-04-11 LAB — IRON AND IRON BINDING CAPACITY (CC-WL,HP ONLY)
Iron: 60 ug/dL (ref 28–170)
Saturation Ratios: 16 % (ref 10.4–31.8)
TIBC: 377 ug/dL (ref 250–450)
UIBC: 317 ug/dL

## 2024-04-11 LAB — CMP (CANCER CENTER ONLY)
ALT: 23 U/L (ref 0–44)
AST: 26 U/L (ref 15–41)
Albumin: 4.7 g/dL (ref 3.5–5.0)
Alkaline Phosphatase: 48 U/L (ref 38–126)
Anion gap: 10 (ref 5–15)
BUN: 17 mg/dL (ref 6–20)
CO2: 26 mmol/L (ref 22–32)
Calcium: 9.5 mg/dL (ref 8.9–10.3)
Chloride: 103 mmol/L (ref 98–111)
Creatinine: 0.75 mg/dL (ref 0.44–1.00)
GFR, Estimated: >60 mL/min (ref >60)
Glucose, Bld: 91 mg/dL (ref 70–99)
Potassium: 4.1 mmol/L (ref 3.5–5.1)
Sodium: 139 mmol/L (ref 135–145)
Total Bilirubin: 0.4 mg/dL (ref 0.0–1.2)
Total Protein: 7 g/dL (ref 6.5–8.1)

## 2024-04-11 LAB — RETICULOCYTES
Immature Retic Fract: 5.5 % (ref 2.3–15.9)
RBC.: 5.61 MIL/uL — ABNORMAL HIGH (ref 3.87–5.11)
Retic Count, Absolute: 95.9 K/uL (ref 19.0–186.0)
Retic Ct Pct: 1.7 % (ref 0.4–3.1)

## 2024-04-11 LAB — SAVE SMEAR(SSMR), FOR PROVIDER SLIDE REVIEW

## 2024-04-11 LAB — FERRITIN: Ferritin: 129 ng/mL (ref 11–307)

## 2024-04-11 NOTE — Progress Notes (Unsigned)
 Hematology/Oncology Consultation   Name: Rosalinda Seaman      MRN: [id number]    Location: Room/bed info not found  Date: 04/11/2024 Time:12:59 PM   REFERRING PHYSICIAN:  Waddell Craze, PA-C  REASON FOR CONSULT:  Elevated hematocrit and hemoglobin    DIAGNOSIS: Erythrocytosis, mild  HISTORY OF PRESENT ILLNESS: Ms. Bessey is a very pleasant 53 yo caucasian female with erythrocytosis intermittently over the last 4 years. Over the last year her Hgb has been staying in the 15.0-16.7 range.  She uses a low dose estrogen patch for symptoms of hot flashes and night sweats. No other hormone replacement use.  She notes dizziness with dehydration.   No falls or syncope reported.  She is on Taltz  for psoriatic arthritis and psoriasis.  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 She has no history of thrombotic event. Her daughter also carried this same mutation.  No known familial history of thrombotic event.  No history of miscarriage. She has 2 children.  She is not currently taking an aspirin daily.  No personal history of cancer. Her father had prostate and kidney cancer and mother had pancreatic.  Spleen is in.  Cologuard in 2022 was negative.  No known history of liver disease.  No history of diabetes or thyroid disease.  No fever, chills, n/v, cough, rash, SOB, chest pain, palpitations, abdominal pain or changes in bowel or bladder habits.  No swelling, tenderness, numbness or tingling in her extremities.   No smoking, ETOH or recreational drug use.  Appetite and hydration are good. Weight is stable at 156 lbs.  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   ROS: All other 10 point review of systems is negative.   PAST MEDICAL HISTORY:   Past Medical History:  Diagnosis Date   Allergy    Arthritis    psoriasis    Encounter for annual physical exam 08/26/2022   Factor V Leiden  [facility])    recent  dx - single, no problems   OAB (overactive bladder)    Reactive airway disease 08/04/2023   SVD (spontaneous vaginal delivery)    x 2   Vitamin D deficiency     ALLERGIES: No Known Allergies    MEDICATIONS:  Current Outpatient Medications on File Prior to Visit  Medication Sig Dispense Refill   cholecalciferol (VITAMIN D) 1000 UNITS tablet Take 2,000 Units by mouth every other day.     estradiol (CLIMARA - DOSED IN MG/24 HR) 0.05 mg/24hr patch Place 0.05 mg onto the skin once a week.     Fexofenadine-Pseudoephedrine (ALLEGRA-D PO) Take by mouth daily.     ibuprofen  (ADVIL ) 800 MG tablet Take 1 tablet (800 mg total) by mouth every 8 (eight) hours as needed. 90 tablet 1   magic mouthwash (nystatin , hydrocortisone , diphenhydrAMINE ) suspension Swish and spit 5 mLs 3 (three) times daily. 240 mL 0   MAGNESIUM PO Take by mouth at bedtime.     mirabegron  ER (MYRBETRIQ ) 25 MG TB24 tablet TAKE 1 TABLET (25 MG TOTAL) BY MOUTH DAILY. 30 tablet 5   Multiple Vitamin (MULTIVITAMIN) capsule Take 1 capsule by mouth daily.     Probiotic Product (PROBIOTIC PO) Take by mouth daily.     TALTZ  80 MG/ML pen INJECT 1 PEN UNDER THE SKIN EVERY 4 WEEKS 3 mL 0   albuterol  (VENTOLIN  HFA) 108 (90 Base) MCG/ACT inhaler Inhale  2 puffs into the lungs every 6 (six) hours as needed for wheezing or shortness of breath. (Patient not taking: Reported on 04/11/2024) 8 g 2   cyclobenzaprine  (FLEXERIL ) 10 MG tablet Take 1 tablet (10 mg total) by mouth 3 (three) times daily as needed for muscle spasms. (Patient not taking: Reported on 04/11/2024) 60 tablet 0   predniSONE  (DELTASONE ) 20 MG tablet 1 po tid for 3 days then 1 po bid for 3 days then 1 po qd for 3 days (Patient not taking: Reported on 04/11/2024) 18 tablet 0   No current facility-administered medications on file prior to visit.     PAST SURGICAL HISTORY Past Surgical History:  Procedure Laterality Date   ABDOMINAL HYSTERECTOMY  partial   2014    ABLATION     done in MD's office    AUGMENTATION MAMMAPLASTY Bilateral 02/25/1999   saline   BREAST SURGERY  02/1999   augmentation    LAPAROSCOPIC ASSISTED VAGINAL HYSTERECTOMY N/A 06/26/2013   Procedure: LAPAROSCOPIC ASSISTED VAGINAL HYSTERECTOMY;  Surgeon: Truman Corona, MD;  Location: [facility];  Service: Gynecology;  Laterality: N/A;   WISDOM TOOTH EXTRACTION      FAMILY HISTORY: Family History  Problem Relation Age of Onset   Pancreatic cancer Mother    Diabetes Mother    Thyroid disease Mother    Fibromyalgia Mother    Cancer Mother    Prostate cancer Father    Cancer Father    Healthy Son    Colon cancer Neg Hx    Rectal cancer Neg Hx    Stomach cancer Neg Hx     SOCIAL HISTORY:  reports that she has never smoked. She has never been exposed to tobacco smoke. She has never used smokeless tobacco. She reports that she does not drink alcohol and does not use drugs.  PERFORMANCE STATUS: The patient's performance status is 0 - Asymptomatic  PHYSICAL EXAM: Most Recent Vital Signs: Blood pressure 127/86, pulse 78, temperature 98 F (36.7 C), temperature source Oral, resp. rate 17, height 5' 5 (1.651 m), weight 156 lb (70.8 kg), last menstrual period 05/07/2013, SpO2 98%. BP 127/86 (BP Location: Right Arm, Patient Position: Sitting)   Pulse 78   Temp 98 F (36.7 C) (Oral)   Resp 17   Ht 5' 5 (1.651 m)   Wt 156 lb (70.8 kg)   LMP 05/07/2013 (LMP Unknown)   SpO2 98%   BMI 25.96 kg/m   General Appearance:    Alert, cooperative, no distress, appears stated age  Head:    Normocephalic, without obvious abnormality, atraumatic  Eyes:    PERRL, conjunctiva/corneas clear, EOM's intact, fundi    benign, both eyes        Throat:   Lips, mucosa, and tongue normal; teeth and gums normal  Neck:   Supple, symmetrical, trachea midline, no adenopathy;    thyroid:  no enlargement/tenderness/nodules; no carotid   bruit or JVD  Back:     Symmetric, no curvature, ROM normal, no  CVA tenderness  Lungs:     Clear to auscultation bilaterally, respirations unlabored  Chest Wall:    No tenderness or deformity   Heart:    Regular rate and rhythm, S1 and S2 normal, no murmur, rub   or gallop     Abdomen:     Soft, non-tender, bowel sounds active all four quadrants,    no masses, no organomegaly        Extremities:   Extremities normal, atraumatic, no cyanosis or  edema  Pulses:   2+ and symmetric all extremities  Skin:   Skin color, texture, turgor normal, no rashes or lesions  Lymph nodes:   Cervical, supraclavicular, and axillary nodes normal  Neurologic:   CNII-XII intact, normal strength, sensation and reflexes    throughout    LABORATORY DATA:  Results for orders placed or performed in visit on 04/11/24 (from the past 48 hours)  CBC with Differential (Cancer Center Only)     Status: Abnormal   Collection Time: 04/11/24 10:51 AM  Result Value Ref Range   WBC Count 6.2 4.0 - 10.5 K/uL   RBC 5.57 (H) 3.87 - 5.11 MIL/uL   Hemoglobin 16.6 (H) 12.0 - 15.0 g/dL   HCT 50.6 (H) 63.9 - 53.9 %   MCV 88.5 80.0 - 100.0 fL   MCH 29.8 26.0 - 34.0 pg   MCHC 33.7 30.0 - 36.0 g/dL   RDW 88.0 88.4 - 84.4 %   Platelet Count 186 150 - 400 K/uL   nRBC 0.0 0.0 - 0.2 %   Neutrophils Relative % 58 %   Neutro Abs 3.7 1.7 - 7.7 K/uL   Lymphocytes Relative 30 %   Lymphs Abs 1.8 0.7 - 4.0 K/uL   Monocytes Relative 7 %   Monocytes Absolute 0.4 0.1 - 1.0 K/uL   Eosinophils Relative 3 %   Eosinophils Absolute 0.2 0.0 - 0.5 K/uL   Basophils Relative 1 %   Basophils Absolute 0.1 0.0 - 0.1 K/uL   Immature Granulocytes 1 %   Abs Immature Granulocytes 0.04 0.00 - 0.07 K/uL    Comment: Performed at [facility], [address] [place], [place] [address]  CMP (Cancer Center only)     Status: None   Collection Time: 04/11/24 10:51 AM  Result Value Ref Range   Sodium 139 135 - 145 mmol/L   Potassium 4.1 3.5 - 5.1 mmol/L   Chloride 103 98 - 111 mmol/L   CO2 26 22 - 32  mmol/L   Glucose, Bld 91 70 - 99 mg/dL    Comment: Glucose reference range applies only to samples taken after fasting for at least 8 hours.   BUN 17 6 - 20 mg/dL   Creatinine 9.24 9.55 - 1.00 mg/dL   Calcium 9.5 8.9 - 89.6 mg/dL   Total Protein 7.0 6.5 - 8.1 g/dL   Albumin 4.7 3.5 - 5.0 g/dL   AST 26 15 - 41 U/L   ALT 23 0 - 44 U/L   Alkaline Phosphatase 48 38 - 126 U/L   Total Bilirubin 0.4 0.0 - 1.2 mg/dL   GFR, Estimated >39 >39 mL/min    Comment: (NOTE) Calculated using the CKD-EPI Creatinine Equation (2021)    Anion gap 10 5 - 15    Comment: Performed at [facility], [address] [place], [place] [address]  Save Smear for Provider Slide Review     Status: None   Collection Time: 04/11/24 10:51 AM  Result Value Ref Range   Smear Review SMEAR STAINED AND AVAILABLE FOR REVIEW     Comment: Performed at [facility], [address] [place], [place] [address]  Reticulocytes     Status: Abnormal   Collection Time: 04/11/24 10:52 AM  Result Value Ref Range   Retic Ct Pct 1.7 0.4 - 3.1 %   RBC. 5.61 (H) 3.87 - 5.11 MIL/uL   Retic Count, Absolute 95.9 19.0 - 186.0 K/uL  Immature Retic Fract 5.5 2.3 - 15.9 %    Comment: Performed at [facility], [address] [place], [place] [address]  Lactate dehydrogenase (LDH)     Status: Abnormal   Collection Time: 04/11/24 10:52 AM  Result Value Ref Range   LDH 210 (H) 98 - 192 U/L    Comment: Performed at [facility], [address] [place], [place] [address]      RADIOGRAPHY: No results found.     PATHOLOGY: None   ASSESSMENT/PLAN: Ms. Mulvehill is a very pleasant 53 yo caucasian female with erythrocytosis intermittently over the last 4 years.  CBC with diff and blood smear reviewed with Dr. Timmy. No abnormality or evidence of malignancy noted.  Iron studies are stable. No evidence of overload.  Epo level is 6.7. Reticulocytes unremarkable.  Follow-up pending JAK 2 result.   All  questions were answered. The patient knows to call the clinic with any problems, questions or concerns. We can certainly see the patient much sooner if necessary.  The patient was discussed with Dr. Timmy and he is in agreement with the aforementioned.   Lauraine Pepper, NP

## 2024-04-12 ENCOUNTER — Ambulatory Visit: Payer: Self-pay

## 2024-04-12 LAB — ERYTHROPOIETIN: Erythropoietin: 6.7 m[IU]/mL (ref 2.6–18.5)

## 2024-04-13 ENCOUNTER — Encounter: Payer: Self-pay | Admitting: Family

## 2024-04-13 NOTE — Progress Notes (Signed)
 Office Visit Note  Patient: Kaitlyn Hardy             Date of Birth: [birth year]           MRN: [id number]             PCP: Nicholaus Credit, PA-C Referring: Nicholaus Credit, PA-C Visit Date: 04/27/2024 Occupation: Data Unavailable  Subjective:  Medication monitoring   History of Present Illness: Kaitlyn Hardy is a 53 y.o. female with history of psoriatic arthritis.  Patient remains on taltz  80 mg sq injections every 28 days.  She is tolerating Taltz  without any side effects or injection site reactions.  She has not had any signs or symptoms of a flare.  Patient reports that she has been under the care of a chiropractor for management of left sided lower back pain.  Patient states that she was experiencing symptoms of sciatica and has now had an x-ray and MRI.  She was initially working with a chiropractor 3 days a week but is now started to space the visits to once every other week.  Her symptoms are about 85% improved. She denies any joint swelling at this time.  She denies any Achilles tendinitis or plantar fasciitis. The psoriasis on her right lower leg has overall been well-controlled.  She has been applying Aquaphor topically which has been helpful. Patient has been evaluated by hematology and had extensive lab work performed.  She will be following back up with hematology on 08/15/2023.  Activities of Daily Living:  Patient reports morning stiffness for 0 minute.   Patient Denies nocturnal pain.  Difficulty dressing/grooming: Denies Difficulty climbing stairs: Denies Difficulty getting out of chair: Denies Difficulty using hands for taps, buttons, cutlery, and/or writing: Reports  Review of Systems  Constitutional:  Negative for fatigue.  HENT:  Positive for mouth sores. Negative for mouth dryness.   Eyes:  Negative for dryness.  Respiratory:  Negative for shortness of breath.   Cardiovascular:  Negative for chest pain and palpitations.  Gastrointestinal:  Negative for blood  in stool, constipation and diarrhea.  Endocrine: Positive for increased urination.  Genitourinary:  Negative for involuntary urination.  Musculoskeletal:  Negative for joint pain, gait problem, joint pain, joint swelling, myalgias, muscle weakness, morning stiffness, muscle tenderness and myalgias.  Skin:  Positive for sensitivity to sunlight. Negative for color change, rash and hair loss.  Allergic/Immunologic: Negative for susceptible to infections.  Neurological:  Negative for dizziness and headaches.  Hematological:  Negative for swollen glands.  Psychiatric/Behavioral:  Negative for depressed mood and sleep disturbance. The patient is not nervous/anxious.     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   Encounter for annual physical exam 08/26/2022   Vasomotor symptoms due to menopause 08/26/2022   Absence of bladder continence 08/26/2022   Bilateral impacted cerumen 08/26/2022   Psoriatic arthritis (HCC) 09/30/2021   Psoriasis 09/30/2021   High risk medication use 09/30/2021   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   Right elbow  pain 06/08/2019   Triceps tendinitis 03/20/2018   Fibroid 06/26/2013    Past Medical History:  Diagnosis Date   Allergy    Arthritis    psoriasis    Encounter for annual physical exam 08/26/2022   Factor V Leiden    recent  dx - single, no problems   OAB (overactive bladder)    Reactive airway disease 08/04/2023   SVD  (spontaneous vaginal delivery)    x 2   Vitamin D deficiency     Family History  Problem Relation Age of Onset   Pancreatic cancer Mother    Diabetes Mother    Thyroid disease Mother    Fibromyalgia Mother    Cancer Mother    Prostate cancer Father    Cancer Father    Healthy Son    Colon cancer Neg Hx    Rectal cancer Neg Hx    Stomach cancer Neg Hx    Past Surgical History:  Procedure Laterality Date   ABDOMINAL HYSTERECTOMY  partial   2014   ABLATION     done in MD's office    AUGMENTATION MAMMAPLASTY Bilateral 02/25/1999   saline   BREAST SURGERY  02/1999   augmentation    LAPAROSCOPIC ASSISTED VAGINAL HYSTERECTOMY N/A 06/26/2013   Procedure: LAPAROSCOPIC ASSISTED VAGINAL HYSTERECTOMY;  Surgeon: Truman Corona, MD;  Location: [facility];  Service: Gynecology;  Laterality: N/A;   WISDOM TOOTH EXTRACTION     Social History   Tobacco Use   Smoking status: Never    Passive exposure: Never   Smokeless tobacco: Never  Vaping Use   Vaping status: Never Used  Substance Use Topics   Alcohol use: No   Drug use: No   Social History   Social History Narrative   Not on file      There is no immunization history on file for this patient.   Objective: Vital Signs: BP 123/79   Pulse 69   Temp 98.4 F (36.9 C)   Resp 14   Ht 5' 5 (1.651 m)   Wt 162 lb 6.4 oz (73.7 kg)   LMP 05/07/2013 (LMP Unknown)   BMI 27.02 kg/m    Physical Exam Vitals and nursing note reviewed.  Constitutional:      Appearance: She is well-developed.  HENT:     Head: Normocephalic and atraumatic.  Eyes:     Conjunctiva/sclera: Conjunctivae normal.  Cardiovascular:     Rate and Rhythm: Normal rate and regular rhythm.     Heart sounds: Normal heart sounds.  Pulmonary:     Effort: Pulmonary effort is normal.     Breath sounds: Normal breath sounds.  Abdominal:     General: Bowel sounds are normal.     Palpations: Abdomen is soft.  Musculoskeletal:     Cervical back: Normal range  of motion.  Lymphadenopathy:     Cervical: No cervical adenopathy.  Skin:    General: Skin is warm and dry.     Capillary Refill: Capillary refill takes less than 2 seconds.  Neurological:     Mental Status: She is alert and oriented to person, place, and time.  Psychiatric:        Behavior: Behavior normal.      Musculoskeletal Exam: C-spine, thoracic spine, lumbar spine have good range of motion.  No midline spinal tenderness.  No SI joint tenderness.  Shoulder joints have good range of motion with some crepitus bilaterally.  Elbow joints, wrist joints, MCPs, PIPs, DIPs have good range  of motion with no synovitis.  CMC, PIP, DIP thickening noted.  Mild tenderness of the left CMC and left first MCP joint.  Complete fist formation bilaterally.  Hip joints have good range of motion with no groin pain.  Knee joints have good range of motion no warmth or effusion.  Ankle joints have good range of motion no tenderness or joint swelling.  No evidence of Achilles tendinitis or plantar fasciitis.   CDAI Exam: CDAI Score: -- Patient Global: --; Provider Global: -- Swollen: --; Tender: -- Joint Exam 04/27/2024   No joint exam has been documented for this visit   There is currently no information documented on the homunculus. Go to the Rheumatology activity and complete the homunculus joint exam.  Investigation: No additional findings.  Imaging: No results found.  Recent Labs: Lab Results  Component Value Date   WBC 6.2 04/11/2024   HGB 16.6 (H) 04/11/2024   PLT 186 04/11/2024   NA 139 04/11/2024   K 4.1 04/11/2024   CL 103 04/11/2024   CO2 26 04/11/2024   GLUCOSE 91 04/11/2024   BUN 17 04/11/2024   CREATININE 0.75 04/11/2024   BILITOT 0.4 04/11/2024   ALKPHOS 48 04/11/2024   AST 26 04/11/2024   ALT 23 04/11/2024   PROT 7.0 04/11/2024   ALBUMIN 4.7 04/11/2024   CALCIUM 9.5 04/11/2024   GFRAA >60 08/28/2019   QFTBGOLDPLUS NEGATIVE 11/16/2023    Speciality Comments:  MTX-fatigue,Cosentyx-diarrhea, Humira-inadequate response,  Procedures:  No procedures performed Allergies: Patient has no known allergies.   Assessment / Plan:     Visit Diagnoses: Psoriatic arthritis (HCC) - Dx at [place] [facility]-09/2019: She has no synovitis or dactylitis on examination today.  She experiences occasional discomfort in the left thumb especially with repetitive or overuse activities.  No inflammation noted today.  She was recently experiencing increased left-sided lower back pain.  She has had an x-ray and MRI and has been going for treatments at a chiropractor clinic.  Her low back pain and sciatica has improved by about 85%.  No SI joint tenderness upon palpation.  No evidence of Achilles tendinitis or plantar fasciitis.  Overall she has clinically been doing well on Taltz  80 mg sq injections once every 28 days.  She is tolerating Taltz  without any side effects or injection site reactions.  No medication changes will be made at this time.  She was advised to notify us  if she develops any signs or symptoms of a flare.  She will follow-up in the office in 5 months or sooner if needed.  Psoriasis - History of hyperkeratosis and hyperpigmentation on anterior aspect of shins--Improved. She has tried several OTC and prescription topical agents in the past.    Using aquaphor topically.   She will remain on taltz  as prescribed.    High risk medication use - Taltz  80 mg sq inj q28 days. Restarted 06/06/2022.(Taltz  80 mg-d/c July 2023 initiated in March 2022.). CBC and CMP updated on 04/11/24.  TB gold negative on 11/16/23 Discussed the importance of holding taltz  if she develops signs or symptoms of an infection and to resume once the infection has completely cleared   Elevated hematocrit: Evaluated by hematology-reviewed encounter from 04/11/2024--no abnormality or evidence of malignancy noted.  Iron studies stable.  No evidence of overload.  Reticulocytes unremarkable.  She will be following  back up with hematology in 08/14/2024.   Elevated hemoglobin: Evaluated by hematology--underwent thorough lab workup.  She will be following back up with hematology on  08/14/2024.  Primary osteoarthritis of both hands: She has CMC, PIP, and DIP thickening.  Intermittent discomfort in the left thumb with repetitive or overuse activities.  Discussed the use of arthritis compression gloves.    Contracture of right elbow: No tenderness or inflammation along the joint line.   Primary osteoarthritis of both feet: She has good ROM of both ankle joints with no tenderness or joint swelling.  No evidence of achilles tendonitis.   Pes cavus of both feet: She is wearing proper fitting shoes.   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   Orders: No orders of the defined types were placed in this encounter.  No orders of the defined types were placed in this encounter.    Follow-Up Instructions: Return in about 5 months (around 09/25/2024) for Psoriatic arthritis.   Waddell CHRISTELLA Craze, PA-C  Note - This record has been created using Dragon software.  Chart creation errors have been sought, but may not always  have been located. Such creation errors do not reflect on  the standard of medical care.

## 2024-04-19 LAB — JAK2 V617F RFX CALR/MPL/E12-15

## 2024-04-19 LAB — CALR +MPL + E12-E15  (REFLEX)

## 2024-04-21 ENCOUNTER — Other Ambulatory Visit: Payer: Self-pay | Admitting: Family

## 2024-04-21 ENCOUNTER — Encounter: Payer: Self-pay | Admitting: Family

## 2024-04-21 DIAGNOSIS — E538 Deficiency of other specified B group vitamins: Secondary | ICD-10-CM

## 2024-04-21 DIAGNOSIS — D751 Secondary polycythemia: Secondary | ICD-10-CM

## 2024-04-26 ENCOUNTER — Ambulatory Visit: Admitting: Rheumatology

## 2024-04-27 ENCOUNTER — Encounter: Payer: Self-pay | Admitting: Physician Assistant

## 2024-04-27 ENCOUNTER — Ambulatory Visit: Attending: Physician Assistant | Admitting: Physician Assistant

## 2024-04-27 VITALS — BP 123/79 | HR 69 | Temp 98.4°F | Resp 14 | Ht 65.0 in | Wt 162.4 lb

## 2024-04-27 DIAGNOSIS — Q6671 Congenital pes cavus, right foot: Secondary | ICD-10-CM

## 2024-04-27 DIAGNOSIS — E559 Vitamin D deficiency, unspecified: Secondary | ICD-10-CM

## 2024-04-27 DIAGNOSIS — M19041 Primary osteoarthritis, right hand: Secondary | ICD-10-CM

## 2024-04-27 DIAGNOSIS — J302 Other seasonal allergic rhinitis: Secondary | ICD-10-CM

## 2024-04-27 DIAGNOSIS — M19042 Primary osteoarthritis, left hand: Secondary | ICD-10-CM

## 2024-04-27 DIAGNOSIS — Q6672 Congenital pes cavus, left foot: Secondary | ICD-10-CM

## 2024-04-27 DIAGNOSIS — Z79899 Other long term (current) drug therapy: Secondary | ICD-10-CM | POA: Diagnosis not present

## 2024-04-27 DIAGNOSIS — R718 Other abnormality of red blood cells: Secondary | ICD-10-CM

## 2024-04-27 DIAGNOSIS — M19072 Primary osteoarthritis, left ankle and foot: Secondary | ICD-10-CM

## 2024-04-27 DIAGNOSIS — L4050 Arthropathic psoriasis, unspecified: Secondary | ICD-10-CM

## 2024-04-27 DIAGNOSIS — L409 Psoriasis, unspecified: Secondary | ICD-10-CM

## 2024-04-27 DIAGNOSIS — M19071 Primary osteoarthritis, right ankle and foot: Secondary | ICD-10-CM

## 2024-04-27 DIAGNOSIS — D6851 Activated protein C resistance: Secondary | ICD-10-CM

## 2024-04-27 DIAGNOSIS — D582 Other hemoglobinopathies: Secondary | ICD-10-CM

## 2024-04-27 DIAGNOSIS — K140 Glossitis: Secondary | ICD-10-CM

## 2024-04-27 DIAGNOSIS — M24521 Contracture, right elbow: Secondary | ICD-10-CM

## 2024-04-27 DIAGNOSIS — R5383 Other fatigue: Secondary | ICD-10-CM

## 2024-05-08 ENCOUNTER — Encounter: Payer: Self-pay | Admitting: Physician Assistant

## 2024-05-08 ENCOUNTER — Other Ambulatory Visit: Payer: Self-pay | Admitting: Physician Assistant

## 2024-05-08 MED ORDER — MIRABEGRON ER 50 MG PO TB24: 50.0000 mg | ORAL_TABLET | Freq: Every day | ORAL | 2 refills | Status: DC

## 2024-05-17 ENCOUNTER — Other Ambulatory Visit: Payer: Self-pay | Admitting: Physician Assistant

## 2024-05-17 ENCOUNTER — Encounter: Payer: Self-pay | Admitting: Physician Assistant

## 2024-05-17 MED ORDER — MIRABEGRON ER 25 MG PO TB24: ORAL_TABLET | ORAL | 2 refills | Status: DC

## 2024-06-19 ENCOUNTER — Other Ambulatory Visit: Payer: Self-pay | Admitting: Physician Assistant

## 2024-06-19 DIAGNOSIS — L409 Psoriasis, unspecified: Secondary | ICD-10-CM

## 2024-06-19 DIAGNOSIS — L4050 Arthropathic psoriasis, unspecified: Secondary | ICD-10-CM

## 2024-06-19 DIAGNOSIS — Z79899 Other long term (current) drug therapy: Secondary | ICD-10-CM

## 2024-06-19 NOTE — Telephone Encounter (Signed)
 Last Fill: 03/14/2024  Labs: 04/11/2024 RBC 5.57, Hgb 16.6, Hct 49.3  TB Gold: 11/16/2023 Neg    Next Visit: 09/28/2024  Last Visit: 04/27/2024  DX: Psoriatic arthritis   Current Dose per office note 04/27/2024: Taltz  80 mg sq inj q28 day   Okay to refill Taltz ?

## 2024-07-06 ENCOUNTER — Encounter: Payer: Self-pay | Admitting: Physician Assistant

## 2024-07-13 ENCOUNTER — Other Ambulatory Visit

## 2024-07-15 ENCOUNTER — Other Ambulatory Visit: Payer: Self-pay | Admitting: Physician Assistant

## 2024-07-17 DIAGNOSIS — Z79899 Other long term (current) drug therapy: Secondary | ICD-10-CM

## 2024-07-17 DIAGNOSIS — Z131 Encounter for screening for diabetes mellitus: Secondary | ICD-10-CM

## 2024-07-17 NOTE — Telephone Encounter (Signed)
 Will release CBC and CMP and fax over to PCP at [phone]. Patient's last TB Gold was 11/16/2023 Negative. Will also release Hemoglobin A1c with patient being on Prednisone  within the last year.

## 2024-07-18 ENCOUNTER — Other Ambulatory Visit

## 2024-07-18 ENCOUNTER — Other Ambulatory Visit: Payer: Self-pay | Admitting: Family Medicine

## 2024-07-18 DIAGNOSIS — R7309 Other abnormal glucose: Secondary | ICD-10-CM | POA: Insufficient documentation

## 2024-07-18 DIAGNOSIS — Z79899 Other long term (current) drug therapy: Secondary | ICD-10-CM

## 2024-07-18 DIAGNOSIS — Z131 Encounter for screening for diabetes mellitus: Secondary | ICD-10-CM

## 2024-07-18 DIAGNOSIS — L4050 Arthropathic psoriasis, unspecified: Secondary | ICD-10-CM

## 2024-07-18 LAB — CBC WITH DIFFERENTIAL/PLATELET
Basophils Absolute: 0.1 x10E3/uL (ref 0.0–0.2)
Basos: 1 %
EOS (ABSOLUTE): 0.2 x10E3/uL (ref 0.0–0.4)
Eos: 3 %
Hematocrit: 46.7 % — ABNORMAL HIGH (ref 34.0–46.6)
Hemoglobin: 16 g/dL — ABNORMAL HIGH (ref 11.1–15.9)
Immature Grans (Abs): 0 x10E3/uL (ref 0.0–0.1)
Immature Granulocytes: 0 %
Lymphocytes Absolute: 1.5 x10E3/uL (ref 0.7–3.1)
Lymphs: 25 %
MCH: 30 pg (ref 26.6–33.0)
MCHC: 34.3 g/dL (ref 31.5–35.7)
MCV: 88 fL (ref 79–97)
Monocytes Absolute: 0.4 x10E3/uL (ref 0.1–0.9)
Monocytes: 7 %
Neutrophils Absolute: 3.7 x10E3/uL (ref 1.4–7.0)
Neutrophils: 64 %
Platelets: 200 x10E3/uL (ref 150–450)
RBC: 5.34 x10E6/uL — ABNORMAL HIGH (ref 3.77–5.28)
RDW: 12.1 % (ref 11.7–15.4)
WBC: 5.8 x10E3/uL (ref 3.4–10.8)

## 2024-07-18 LAB — COMPREHENSIVE METABOLIC PANEL WITH GFR
ALT: 34 IU/L — ABNORMAL HIGH (ref 0–32)
AST: 26 IU/L (ref 0–40)
Albumin: 4.5 g/dL (ref 3.8–4.9)
Alkaline Phosphatase: 51 IU/L (ref 49–135)
BUN/Creatinine Ratio: 20 (ref 9–23)
BUN: 15 mg/dL (ref 6–24)
Bilirubin Total: 0.6 mg/dL (ref 0.0–1.2)
CO2: 24 mmol/L (ref 20–29)
Calcium: 9.7 mg/dL (ref 8.7–10.2)
Chloride: 101 mmol/L (ref 96–106)
Creatinine, Ser: 0.76 mg/dL (ref 0.57–1.00)
Globulin, Total: 1.6 g/dL (ref 1.5–4.5)
Glucose: 86 mg/dL (ref 70–99)
Potassium: 4.7 mmol/L (ref 3.5–5.2)
Sodium: 139 mmol/L (ref 134–144)
Total Protein: 6.1 g/dL (ref 6.0–8.5)
eGFR: 93 mL/min/1.73

## 2024-07-18 LAB — HEMOGLOBIN A1C
Est. average glucose Bld gHb Est-mCnc: 111 mg/dL
Hgb A1c MFr Bld: 5.5 % (ref 4.8–5.6)

## 2024-07-19 ENCOUNTER — Ambulatory Visit: Payer: Self-pay | Admitting: Physician Assistant

## 2024-07-28 ENCOUNTER — Telehealth: Payer: Self-pay

## 2024-07-28 ENCOUNTER — Other Ambulatory Visit (HOSPITAL_COMMUNITY): Payer: Self-pay

## 2024-07-28 DIAGNOSIS — L4050 Arthropathic psoriasis, unspecified: Secondary | ICD-10-CM

## 2024-07-28 DIAGNOSIS — L409 Psoriasis, unspecified: Secondary | ICD-10-CM

## 2024-07-28 DIAGNOSIS — Z79899 Other long term (current) drug therapy: Secondary | ICD-10-CM

## 2024-07-28 NOTE — Telephone Encounter (Signed)
 Patient sent MyChart message with updated insurance however member is not found and old insurance is still pulling as active. MyChart message sent to patient

## 2024-07-31 NOTE — Telephone Encounter (Signed)
 Still unable to pull insurance through and patient not found on CMM  Called [employer] to initiate authorization for Taltz  over phone. Rep directed me to fax form. Completed and faxed with clinicals  Fax: [phone] Phone: [phone]  Sherry Pennant, PharmD, MPH, BCPS, CPP Clinical Pharmacist

## 2024-08-01 MED ORDER — TALTZ 80 MG/ML ~~LOC~~ SOAJ
80.0000 mg | SUBCUTANEOUS | 0 refills | Status: AC
  Filled 2024-08-04 – 2024-08-09 (×2): qty 1, 28d supply, fill #0
  Filled 2024-08-31: qty 1, 28d supply, fill #1

## 2024-08-01 NOTE — Telephone Encounter (Signed)
 Received fax from [employer]. Patient must fill with [facility]. Rx sent to [facility]. Patient's next dose is due 08/05/2024  Copay card is in media tab on 11/03/2021    Advised patient to contact copay card company to ensure funds are re-authorized.  Sherry Pennant, PharmD, MPH, BCPS, CPP Clinical Pharmacist

## 2024-08-01 NOTE — Addendum Note (Signed)
 Addended by: DAYNE SHERRY RAMAN on: 08/01/2024 04:42 PM   Modules accepted: Orders

## 2024-08-01 NOTE — Telephone Encounter (Signed)
 Received notification from [facility] regarding a prior authorization for Taltz . Authorization has been APPROVED from 07/31/2024 to 07/31/2025.    Authorization # [id number] Phone # [phone]

## 2024-08-02 ENCOUNTER — Other Ambulatory Visit (HOSPITAL_COMMUNITY): Payer: Self-pay

## 2024-08-04 ENCOUNTER — Other Ambulatory Visit: Payer: Self-pay

## 2024-08-04 ENCOUNTER — Telehealth: Payer: Self-pay

## 2024-08-04 ENCOUNTER — Other Ambulatory Visit (HOSPITAL_COMMUNITY): Payer: Self-pay

## 2024-08-04 NOTE — Telephone Encounter (Signed)
 Attempting to run test claim for Taltz  (pen AND pfs) results in the following rejection message:   Called [employer] and spoke with rep, after going in a circle regarding the need for a PA, the fact we already have a PA approved and the medication is still rejecting as non-formulary, and then back to needing a PA, I am informed that there is also possible a quantity exception rejection. This is unlikely as the rx was processed as 1 mL per 28 days, which is standard dosing for this medication. However the rep could not see the claim I was referring to due to it being a test claim. Sent rx through and got it into a processed state, however she still states that she cannot see it. While attempting to troubleshoot my [employer] loses it's connection and the call drops and I was unable to reconnect. Will seemingly have to try again on Monday, hopefully by then the claim will be visible on the insurance's side.

## 2024-08-07 ENCOUNTER — Other Ambulatory Visit: Payer: Self-pay

## 2024-08-08 ENCOUNTER — Other Ambulatory Visit: Payer: Self-pay

## 2024-08-08 ENCOUNTER — Other Ambulatory Visit (HOSPITAL_COMMUNITY): Payer: Self-pay

## 2024-08-08 NOTE — Telephone Encounter (Signed)
 Called [facility] for update on Taltz  prescription. Per rep, rejecting is stating plan limitation exceeded - likely meaning that patient locked out of their pharmacy.  Test claim processes successfully but copay card is rejecting (as expected) for being expired. MyChart message sent to patient for update on this - she had been previously advised to contact copay card to renew.    Sherry Pennant, PharmD, MPH, BCPS, CPP Clinical Pharmacist

## 2024-08-08 NOTE — Telephone Encounter (Signed)
 SABRA

## 2024-08-09 ENCOUNTER — Other Ambulatory Visit: Payer: Self-pay

## 2024-08-09 ENCOUNTER — Other Ambulatory Visit (HOSPITAL_COMMUNITY): Payer: Self-pay

## 2024-08-09 NOTE — Progress Notes (Signed)
 Specialty Pharmacy Initial Fill Coordination Note  Kaitlyn Hardy Wagoner is a 54 y.o. female contacted today regarding initial fill of specialty medication(s) Ixekizumab  (Taltz )   Patient requested Delivery   Delivery date: 08/11/24   Verified address: [address]  [place] [address]   Medication will be filled on: 08/10/24   Patient is aware of $5 copayment. CC has been collected and forwarded to [facility]. Her rx is also already on file and I have requested that it be queued up for dispense.

## 2024-08-09 NOTE — Telephone Encounter (Signed)
 Copay card added to [facility], test claim reveals that copay is now $5. Will complete onboarding outreach.

## 2024-08-10 ENCOUNTER — Other Ambulatory Visit: Payer: Self-pay

## 2024-08-10 ENCOUNTER — Other Ambulatory Visit (HOSPITAL_COMMUNITY): Payer: Self-pay

## 2024-08-11 ENCOUNTER — Other Ambulatory Visit (HOSPITAL_COMMUNITY): Payer: Self-pay

## 2024-08-11 ENCOUNTER — Telehealth: Payer: Self-pay

## 2024-08-11 NOTE — Telephone Encounter (Addendum)
 Pharmacy Patient Advocate Encounter  Insurance verification completed.   The patient is insured through [facility]   Ran test claim for Myrbetriq . Currently a quantity of 30 is a 30 day supply and the co-pay is 25.00 .   This test claim was processed through [facility]- copay amounts may vary at other pharmacies due to pharmacy/plan contracts, or as the patient moves through the different stages of their insurance plan.   Pt.'s plan will pay for the 50mg  tablets, so we need to proceed with PA for the 25mg  tablets?

## 2024-08-11 NOTE — Telephone Encounter (Signed)
 Pharmacy Patient Advocate Encounter   Received notification from [facility] that prior authorization for Myrbetriq  is required/requested.   Insurance verification completed.   The patient is insured through [facility].   Per test claim: PA required; PA submitted to above mentioned insurance via Latent Key/confirmation #/EOC [id number] Status is pending

## 2024-08-11 NOTE — Progress Notes (Signed)
 Patient on Taltz  for PsA + Pso since 2022. Clinically doing well on maintenance dose of 80mg  subcut every 4 weeks  Treatment fialure with Cosentyx, MTX and Humira  Trishelle Devora, PharmD, MPH, BCPS, CPP Clinical Pharmacist

## 2024-08-14 ENCOUNTER — Other Ambulatory Visit: Payer: Self-pay | Admitting: Physician Assistant

## 2024-08-14 ENCOUNTER — Ambulatory Visit: Admitting: Family

## 2024-08-14 ENCOUNTER — Other Ambulatory Visit

## 2024-08-14 MED ORDER — MIRABEGRON ER 50 MG PO TB24: 50.0000 mg | ORAL_TABLET | Freq: Every day | ORAL | 2 refills | Status: AC

## 2024-08-14 NOTE — Telephone Encounter (Signed)
 New rx sent

## 2024-08-14 NOTE — Telephone Encounter (Signed)
 Send to prior Auth team

## 2024-08-14 NOTE — Telephone Encounter (Signed)
 Call pt and notify insurance will cover Myrbetriq  50mg  once daily Any reason why she takes 25mg  twice daily instead -- insurance will need PA if she wants to continue this dose

## 2024-08-29 ENCOUNTER — Inpatient Hospital Stay: Payer: Self-pay

## 2024-08-29 ENCOUNTER — Inpatient Hospital Stay: Admitting: Family

## 2024-08-29 ENCOUNTER — Inpatient Hospital Stay

## 2024-08-29 ENCOUNTER — Inpatient Hospital Stay: Payer: Self-pay | Admitting: Family

## 2024-08-31 ENCOUNTER — Other Ambulatory Visit (HOSPITAL_COMMUNITY): Payer: Self-pay

## 2024-09-28 ENCOUNTER — Ambulatory Visit: Admitting: Physician Assistant
